# Patient Record
Sex: Female | Born: 1965 | Race: Black or African American | Hispanic: No | Smoking: Former smoker
Health system: Southern US, Community
[De-identification: ages and names within clinical notes are randomized; demographics above are authoritative.]

## PROBLEM LIST (undated history)

## (undated) DIAGNOSIS — F419 Anxiety disorder, unspecified: Secondary | ICD-10-CM

## (undated) DIAGNOSIS — F1990 Other psychoactive substance use, unspecified, uncomplicated: Secondary | ICD-10-CM

## (undated) DIAGNOSIS — G4730 Sleep apnea, unspecified: Secondary | ICD-10-CM

## (undated) DIAGNOSIS — E559 Vitamin D deficiency, unspecified: Secondary | ICD-10-CM

## (undated) DIAGNOSIS — M170 Bilateral primary osteoarthritis of knee: Secondary | ICD-10-CM

## (undated) DIAGNOSIS — R12 Heartburn: Secondary | ICD-10-CM

## (undated) DIAGNOSIS — M25475 Effusion, left foot: Secondary | ICD-10-CM

## (undated) DIAGNOSIS — M7070 Other bursitis of hip, unspecified hip: Secondary | ICD-10-CM

## (undated) DIAGNOSIS — M1990 Unspecified osteoarthritis, unspecified site: Secondary | ICD-10-CM

## (undated) DIAGNOSIS — F32A Depression, unspecified: Secondary | ICD-10-CM

## (undated) DIAGNOSIS — M25472 Effusion, left ankle: Secondary | ICD-10-CM

## (undated) DIAGNOSIS — M719 Bursopathy, unspecified: Secondary | ICD-10-CM

## (undated) DIAGNOSIS — K5900 Constipation, unspecified: Secondary | ICD-10-CM

## (undated) DIAGNOSIS — R0602 Shortness of breath: Secondary | ICD-10-CM

## (undated) DIAGNOSIS — M25474 Effusion, right foot: Secondary | ICD-10-CM

## (undated) DIAGNOSIS — E669 Obesity, unspecified: Secondary | ICD-10-CM

## (undated) DIAGNOSIS — E739 Lactose intolerance, unspecified: Secondary | ICD-10-CM

## (undated) DIAGNOSIS — M25471 Effusion, right ankle: Secondary | ICD-10-CM

## (undated) DIAGNOSIS — M2550 Pain in unspecified joint: Secondary | ICD-10-CM

## (undated) DIAGNOSIS — I509 Heart failure, unspecified: Secondary | ICD-10-CM

## (undated) DIAGNOSIS — I10 Essential (primary) hypertension: Secondary | ICD-10-CM

## (undated) DIAGNOSIS — F329 Major depressive disorder, single episode, unspecified: Secondary | ICD-10-CM

## (undated) DIAGNOSIS — C801 Malignant (primary) neoplasm, unspecified: Secondary | ICD-10-CM

## (undated) HISTORY — DX: Other psychoactive substance use, unspecified, uncomplicated: F19.90

## (undated) HISTORY — DX: Constipation, unspecified: K59.00

## (undated) HISTORY — DX: Bilateral primary osteoarthritis of knee: M17.0

## (undated) HISTORY — DX: Depression, unspecified: F32.A

## (undated) HISTORY — DX: Effusion, left foot: M25.475

## (undated) HISTORY — DX: Sleep apnea, unspecified: G47.30

## (undated) HISTORY — DX: Unspecified osteoarthritis, unspecified site: M19.90

## (undated) HISTORY — DX: Other bursitis of hip, unspecified hip: M70.70

## (undated) HISTORY — DX: Bursopathy, unspecified: M71.9

## (undated) HISTORY — DX: Pain in unspecified joint: M25.50

## (undated) HISTORY — DX: Lactose intolerance, unspecified: E73.9

## (undated) HISTORY — DX: Anxiety disorder, unspecified: F41.9

## (undated) HISTORY — DX: Vitamin D deficiency, unspecified: E55.9

## (undated) HISTORY — DX: Heart failure, unspecified: I50.9

## (undated) HISTORY — DX: Heartburn: R12

## (undated) HISTORY — DX: Effusion, right ankle: M25.471

## (undated) HISTORY — DX: Effusion, left ankle: M25.472

## (undated) HISTORY — DX: Effusion, right foot: M25.474

## (undated) HISTORY — DX: Shortness of breath: R06.02

---
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
## 1997-10-05 ENCOUNTER — Ambulatory Visit (HOSPITAL_COMMUNITY): Admission: RE | Admit: 1997-10-05 | Discharge: 1997-10-05 | Payer: Self-pay | Admitting: Podiatry

## 2002-10-20 ENCOUNTER — Ambulatory Visit (HOSPITAL_BASED_OUTPATIENT_CLINIC_OR_DEPARTMENT_OTHER): Admission: RE | Admit: 2002-10-20 | Discharge: 2002-10-20 | Payer: Self-pay | Admitting: Internal Medicine

## 2003-08-21 ENCOUNTER — Ambulatory Visit (HOSPITAL_COMMUNITY): Admission: RE | Admit: 2003-08-21 | Discharge: 2003-08-21 | Payer: Self-pay | Admitting: *Deleted

## 2003-08-21 ENCOUNTER — Encounter (INDEPENDENT_AMBULATORY_CARE_PROVIDER_SITE_OTHER): Payer: Self-pay | Admitting: Specialist

## 2006-10-15 ENCOUNTER — Encounter: Admission: RE | Admit: 2006-10-15 | Discharge: 2006-10-15 | Payer: Self-pay | Admitting: Internal Medicine

## 2007-10-28 ENCOUNTER — Encounter: Admission: RE | Admit: 2007-10-28 | Discharge: 2007-10-28 | Payer: Self-pay | Admitting: Internal Medicine

## 2007-12-17 ENCOUNTER — Encounter: Admission: RE | Admit: 2007-12-17 | Discharge: 2007-12-17 | Payer: Self-pay | Admitting: Internal Medicine

## 2008-05-15 ENCOUNTER — Ambulatory Visit (HOSPITAL_COMMUNITY): Admission: RE | Admit: 2008-05-15 | Discharge: 2008-05-15 | Payer: Self-pay | Admitting: Obstetrics

## 2009-12-04 ENCOUNTER — Inpatient Hospital Stay (HOSPITAL_COMMUNITY): Admission: EM | Admit: 2009-12-04 | Discharge: 2009-12-07 | Payer: Self-pay | Admitting: Emergency Medicine

## 2009-12-05 ENCOUNTER — Encounter (INDEPENDENT_AMBULATORY_CARE_PROVIDER_SITE_OTHER): Payer: Self-pay | Admitting: Internal Medicine

## 2010-01-09 HISTORY — PX: KNEE SURGERY: SHX244

## 2010-03-22 LAB — LIPID PANEL
LDL Cholesterol: 108 mg/dL — ABNORMAL HIGH (ref 0–99)
Total CHOL/HDL Ratio: 3.8 RATIO
Triglycerides: 72 mg/dL (ref <150)
VLDL: 14 mg/dL (ref 0–40)

## 2010-03-22 LAB — CK TOTAL AND CKMB (NOT AT ARMC)
CK, MB: 3.8 ng/mL (ref 0.3–4.0)
CK, MB: 4.5 ng/mL — ABNORMAL HIGH (ref 0.3–4.0)
Relative Index: 1.7 (ref 0.0–2.5)
Relative Index: 1.9 (ref 0.0–2.5)
Relative Index: 2.1 (ref 0.0–2.5)
Total CK: 202 U/L — ABNORMAL HIGH (ref 7–177)
Total CK: 215 U/L — ABNORMAL HIGH (ref 7–177)

## 2010-03-22 LAB — CBC
HCT: 37.5 % (ref 36.0–46.0)
HCT: 40 % (ref 36.0–46.0)
Hemoglobin: 12.8 g/dL (ref 12.0–15.0)
Hemoglobin: 12.9 g/dL (ref 12.0–15.0)
MCH: 31.5 pg (ref 26.0–34.0)
MCHC: 32.7 g/dL (ref 30.0–36.0)
MCHC: 34.4 g/dL (ref 30.0–36.0)
MCV: 92.8 fL (ref 78.0–100.0)
MCV: 93.2 fL (ref 78.0–100.0)
Platelets: 256 10*3/uL (ref 150–400)
RDW: 15 % (ref 11.5–15.5)
RDW: 15.2 % (ref 11.5–15.5)
WBC: 9.4 10*3/uL (ref 4.0–10.5)

## 2010-03-22 LAB — POCT CARDIAC MARKERS
CKMB, poc: 2.3 ng/mL (ref 1.0–8.0)
Myoglobin, poc: 69.2 ng/mL (ref 12–200)

## 2010-03-22 LAB — BASIC METABOLIC PANEL
BUN: 10 mg/dL (ref 6–23)
CO2: 26 mEq/L (ref 19–32)
CO2: 28 mEq/L (ref 19–32)
CO2: 29 mEq/L (ref 19–32)
Chloride: 101 mEq/L (ref 96–112)
Chloride: 102 mEq/L (ref 96–112)
Chloride: 105 mEq/L (ref 96–112)
Creatinine, Ser: 0.95 mg/dL (ref 0.4–1.2)
Creatinine, Ser: 1.13 mg/dL (ref 0.4–1.2)
GFR calc Af Amer: >60 mL/min (ref >60)
GFR calc non Af Amer: 55 mL/min — ABNORMAL LOW (ref >60)
Glucose, Bld: 101 mg/dL — ABNORMAL HIGH (ref 70–99)
Potassium: 3.9 mEq/L (ref 3.5–5.1)
Potassium: 4.2 mEq/L (ref 3.5–5.1)
Sodium: 140 mEq/L (ref 135–145)

## 2010-03-22 LAB — URINALYSIS, ROUTINE W REFLEX MICROSCOPIC
Bilirubin Urine: NEGATIVE
Ketones, ur: NEGATIVE mg/dL
Nitrite: NEGATIVE
Urobilinogen, UA: 0.2 mg/dL (ref 0.0–1.0)

## 2010-03-22 LAB — MAGNESIUM: Magnesium: 2 mg/dL (ref 1.5–2.5)

## 2010-03-22 LAB — MRSA PCR SCREENING: MRSA by PCR: NEGATIVE

## 2010-03-22 LAB — RAPID URINE DRUG SCREEN, HOSP PERFORMED: Tetrahydrocannabinol: POSITIVE — AB

## 2010-03-22 LAB — HEMOGLOBIN A1C: Mean Plasma Glucose: 111 mg/dL (ref <117)

## 2010-04-19 LAB — CBC
MCV: 95.3 fL (ref 78.0–100.0)
RBC: 4.1 MIL/uL (ref 3.87–5.11)
WBC: 9.2 10*3/uL (ref 4.0–10.5)

## 2010-05-27 NOTE — Op Note (Signed)
NAMECHIQUITTA, MATTY                           ACCOUNT NO.:  [id number]   MEDICAL RECORD NO.:  [id number]                   PATIENT TYPE:  AMB   LOCATION:  SDC                                  FACILITY:  [facility]   PHYSICIAN:  Narka B. Earlene Plater, M.D.               DATE OF BIRTH:  Feb 25, 1965   DATE OF PROCEDURE:  08/21/2003  DATE OF DISCHARGE:                                 OPERATIVE REPORT   PREOPERATIVE DIAGNOSES:  Nine week missed abortion.   POSTOPERATIVE DIAGNOSES:  Nine week missed abortion.   PROCEDURE:  Suction curettage.   ANESTHESIA:  MAC and 20 mL 2% lidocaine paracervical block.   SPECIMENS:  Products of conception.   COMPLICATIONS:  None.   INDICATIONS FOR PROCEDURE:  Patient with an unintended pregnancy with unsure  last menstrual period. Ultrasound in the office showed a nine week missed  AB, presents for suction curettage.   DESCRIPTION OF PROCEDURE:  The patient was taken to the operating room and  MAC anesthesia obtained. She was placed in the [place] stirrups and then  prepped and draped in a standard fashion.  The bladder was emptied with a  red rubber catheter.   Speculum inserted, paracervical block placed. Cervix dilated to a #21  without difficulty. The #7 curved suction cannula inserted. With suction on  products return.  Several passes were required to clear the uterus.  The  endometrium was then curetted and a gritty texture noted throughout. The  suction was passed once again and no additional tissue returned. The single  tooth was removed, cervix was hemostatic.   The patient tolerated the procedure well and there were no complications.  She was taken to the recovery room awake, alert in stable condition.                                               Gerri Spore B. Earlene Plater, M.D.    WBD/MEDQ  D:  08/21/2003  T:  08/22/2003  Job:  [id number]

## 2011-04-30 ENCOUNTER — Encounter (HOSPITAL_COMMUNITY): Payer: Self-pay | Admitting: Emergency Medicine

## 2011-04-30 ENCOUNTER — Emergency Department (HOSPITAL_COMMUNITY): Payer: 59

## 2011-04-30 ENCOUNTER — Emergency Department (HOSPITAL_COMMUNITY)
Admission: EM | Admit: 2011-04-30 | Discharge: 2011-04-30 | Disposition: A | Discharge status: Home / Self Care | Payer: 59 | Attending: Emergency Medicine | Admitting: Emergency Medicine

## 2011-04-30 DIAGNOSIS — I10 Essential (primary) hypertension: Secondary | ICD-10-CM | POA: Insufficient documentation

## 2011-04-30 DIAGNOSIS — R0602 Shortness of breath: Secondary | ICD-10-CM | POA: Insufficient documentation

## 2011-04-30 DIAGNOSIS — I509 Heart failure, unspecified: Secondary | ICD-10-CM | POA: Insufficient documentation

## 2011-04-30 HISTORY — DX: Obesity, unspecified: E66.9

## 2011-04-30 HISTORY — DX: Essential (primary) hypertension: I10

## 2011-04-30 HISTORY — DX: Malignant (primary) neoplasm, unspecified: C80.1

## 2011-04-30 LAB — BASIC METABOLIC PANEL
CO2: 23 mEq/L (ref 19–32)
Calcium: 8.9 mg/dL (ref 8.4–10.5)
Chloride: 104 mEq/L (ref 96–112)
Creatinine, Ser: 1.13 mg/dL — ABNORMAL HIGH (ref 0.50–1.10)
Glucose, Bld: 97 mg/dL (ref 70–99)

## 2011-04-30 LAB — CBC
HCT: 36.8 % (ref 36.0–46.0)
Hemoglobin: 12.4 g/dL (ref 12.0–15.0)
MCV: 93.2 fL (ref 78.0–100.0)
RBC: 3.95 MIL/uL (ref 3.87–5.11)
WBC: 4.9 10*3/uL (ref 4.0–10.5)

## 2011-04-30 LAB — DIFFERENTIAL
Eosinophils Relative: 2 % (ref 0–5)
Lymphocytes Relative: 34 % (ref 12–46)
Lymphs Abs: 1.7 10*3/uL (ref 0.7–4.0)
Monocytes Absolute: 0.6 10*3/uL (ref 0.1–1.0)
Monocytes Relative: 12 % (ref 3–12)

## 2011-04-30 MED ORDER — FUROSEMIDE 40 MG PO TABS: 40.0000 mg | ORAL_TABLET | Freq: Every day | ORAL | Status: DC

## 2011-04-30 MED ORDER — FUROSEMIDE 10 MG/ML IJ SOLN
40.0000 mg | Freq: Once | INTRAMUSCULAR | Status: AC
  Administered 2011-04-30: 40 mg via INTRAVENOUS
  Filled 2011-04-30: qty 4

## 2011-04-30 NOTE — Discharge Instructions (Signed)
Your chest x-ray, and blood test show mild heart failure.  Your oxygen saturation is normal.  Use Lasix to remove fluid from your lungs.  Followup with your cardiologist as scheduled.  Return for worse or uncontrolled symptoms

## 2011-04-30 NOTE — ED Notes (Signed)
PT. REPORTS PROGRESSING SOB WITH PRODUCTIVE COUGH FOR SEVERAL DAYS WITH BILATERAL LOWER LEGS SWELLING / EDEMA. STATES HISTORY OF CHF.

## 2011-04-30 NOTE — ED Provider Notes (Signed)
History     CSN: [id number]  Arrival date & time 04/30/11  7829   First MD Initiated Contact with Patient 04/30/11 0301      Chief Complaint  Patient presents with  . Shortness of Breath    (Consider location/radiation/quality/duration/timing/severity/associated sxs/prior treatment) Patient is a 46 y.o. female presenting with shortness of breath. The history is provided by the patient.  Shortness of Breath  Associated symptoms include chest pain, cough and shortness of breath. Pertinent negatives include no fever.   the patient is a 46 year old morbidly obese female, with a history of congestive heart failure, who presents to emergency department complaining of aggressive cough with yellow sputum, and shortness of breath, with past 2 weeks.  She states that she ran out of her Lasix.  She denies nausea, vomiting, fevers, chills.  She states that her legs are swollen.  2.  She does not have orthopnea.  She is able to sleep on her side at nighttime.  She does have nocturia, however.  She has been having intermittent chest pain, as well, but it only last for a few seconds at a time.  Currently, she has no chest  Past Medical History  Diagnosis Date  . Obesity   . Cancer   . Hypertension     Past Surgical History  Procedure Date  . Cesarean section     History reviewed. No pertinent family history.  History  Substance Use Topics  . Smoking status: Never Smoker   . Smokeless tobacco: Not on file  . Alcohol Use: Yes    OB History    Grav Para Term Preterm Abortions TAB SAB Ect Mult Living                  Review of Systems  Constitutional: Negative for fever and chills.  Respiratory: Positive for cough and shortness of breath. Negative for chest tightness.   Cardiovascular: Positive for chest pain.       Chest pain is intermittent, and only a second  Gastrointestinal: Negative for nausea and vomiting.  Neurological: Negative for headaches.  Psychiatric/Behavioral:  Negative for confusion.  All other systems reviewed and are negative.    Allergies  Review of patient's allergies indicates no known allergies.  Home Medications   Current Outpatient Rx  Name Route Sig Dispense Refill  . VITAMIN D-3 PO Oral Take 1 capsule by mouth daily.    Marland Kitchen HYDRALAZINE HCL 50 MG PO TABS Oral Take 50 mg by mouth 3 (three) times daily.    Marland Kitchen LABETALOL HCL 200 MG PO TABS Oral Take 600 mg by mouth 2 (two) times daily.    Marland Kitchen POTASSIUM CHLORIDE CRYS ER 20 MEQ PO TBCR Oral Take 20 mEq by mouth daily.    Marland Kitchen SIMVASTATIN 10 MG PO TABS Oral Take 10 mg by mouth daily.      BP 171/114  Pulse 88  Temp 98.8 F (37.1 C)  Resp 16  SpO2 95%  LMP 04/29/2011  Physical Exam  Vitals reviewed. Constitutional: She is oriented to person, place, and time. No distress.       Morbidly obese, speaking in full sentences without  HENT:  Head: Normocephalic and atraumatic.  Eyes: Conjunctivae are normal.  Neck: Normal range of motion. Neck supple.  Cardiovascular: Normal rate.   No murmur heard. Pulmonary/Chest: Effort normal. No respiratory distress. She has no wheezes. She has no rales.  Abdominal: Soft. She exhibits no distension.  Musculoskeletal: Normal range of motion. She exhibits no  edema and no tenderness.  Neurological: She is alert and oriented to person, place, and time.  Skin: Skin is warm and dry.  Psychiatric: She has a normal mood and affect. Thought content normal.    ED Course  Procedures (including critical care time) 46 year old, female, who is morbidly obese and has history of congestive heart failure, presents with progressive cough, and shortness of breath.  For 2 weeks since she ran out of Lasix.  She appears to be in no distress.  Her lungs are clear.  However, given her symptoms and the fact she has not been on Lasix.  We will perform a chest x-ray, and laboratory testing, to see if she is in heart failure.   Labs Reviewed  CBC  DIFFERENTIAL  BASIC  METABOLIC PANEL  PRO B NATRIURETIC PEPTIDE   No results found.   ED ECG REPORT     ECG ECG.  Time 0 258 Normal sinus rhythm with rate 85 Normal No conduction abnormalities Left atrial enlargement, and nonspecific T-wave changes Impression normal sinus rhythm with left atrial enlargement, and nonspecific T-wave changes       4:15 AM Just started urinating after lasix.   I explained cxr and lab findings and plan to release her.  She is very happy that she does not require admission. Sat on ra is 98% with unlabored resp.   MDM  Cough Very mild chf. No resp distress or hypoxia.        Cheri Guppy, MD 04/30/11 [phone]

## 2011-04-30 NOTE — ED Notes (Signed)
Pt reports having symptoms of CHF for a week.  States that she has had edema in bilateral legs, wheezing, coughing up yellow sputum and a 'full stomach'.  Pt reports intermittent CP.  Denies N/V.  Skin warm, dry and intact.  Neuro intact.  Pt is supposed to take 20mg  lasix daily but hasn't taken it since December.  Has an appointment with cardiologist on Thursday.pt speaking in complete sentences, able to lay down flat.

## 2011-04-30 NOTE — ED Notes (Signed)
Rx x 1, pt voiced understanding to f/u with cardiologist and take rx as prescribed.

## 2011-08-02 ENCOUNTER — Other Ambulatory Visit: Payer: Self-pay | Admitting: Internal Medicine

## 2011-08-02 DIAGNOSIS — Z1231 Encounter for screening mammogram for malignant neoplasm of breast: Secondary | ICD-10-CM

## 2012-01-31 ENCOUNTER — Encounter (HOSPITAL_BASED_OUTPATIENT_CLINIC_OR_DEPARTMENT_OTHER): Payer: 59 | Attending: General Surgery

## 2012-01-31 DIAGNOSIS — I872 Venous insufficiency (chronic) (peripheral): Secondary | ICD-10-CM | POA: Insufficient documentation

## 2012-01-31 DIAGNOSIS — L97809 Non-pressure chronic ulcer of other part of unspecified lower leg with unspecified severity: Secondary | ICD-10-CM | POA: Insufficient documentation

## 2012-01-31 DIAGNOSIS — I10 Essential (primary) hypertension: Secondary | ICD-10-CM | POA: Insufficient documentation

## 2012-02-01 NOTE — Progress Notes (Signed)
[facility]  NAME:  Erin Barrett, Erin Barrett               ACCOUNT NO.:  [id number]  MEDICAL RECORD NO.:  [id number]      DATE OF BIRTH:  18-Jul-1965  PHYSICIAN:  Ardath Sax, M.D.           VISIT DATE:                                  OFFICE VISIT   She is a morbidly obese 47 year old lady who suffered some trauma to her lateral left leg and has a nonhealing ulcer for a couple of months.  She has venous hypertension and chronic venous ulcer that really has caused a trauma to develop in this longstanding ulcer.  It is about 2 cm in diameter.  I did clean it up with a curette and got some of the devitalized tissue out and we are going to treat this with silver alginate and then perhaps later we can use an Apligraf when it is cleaned up.  She is as I said a very obese.  She weighs 380 pounds.  She is active as a Dietitian.  She does take some medicine for mild hypertension.  She takes labetalol and she also takes Lasix.  So, we will see her in next week.  We will treat her with silver alginate.  I will say her diagnosis, 1. Traumatic ulcer of lateral left leg complicated by venous     hypertension. 2. Morbid obesity. 3. Hypertension.     Ardath Sax, M.D.     PP/MEDQ  D:  01/31/2012  T:  02/01/2012  Job:  [id number]

## 2012-02-14 ENCOUNTER — Encounter (HOSPITAL_BASED_OUTPATIENT_CLINIC_OR_DEPARTMENT_OTHER): Payer: 59 | Attending: General Surgery

## 2012-02-14 DIAGNOSIS — L97909 Non-pressure chronic ulcer of unspecified part of unspecified lower leg with unspecified severity: Secondary | ICD-10-CM | POA: Insufficient documentation

## 2012-03-13 ENCOUNTER — Encounter (HOSPITAL_BASED_OUTPATIENT_CLINIC_OR_DEPARTMENT_OTHER): Payer: 59 | Attending: General Surgery

## 2012-03-13 DIAGNOSIS — L97809 Non-pressure chronic ulcer of other part of unspecified lower leg with unspecified severity: Secondary | ICD-10-CM | POA: Insufficient documentation

## 2012-03-13 DIAGNOSIS — I87309 Chronic venous hypertension (idiopathic) without complications of unspecified lower extremity: Secondary | ICD-10-CM | POA: Insufficient documentation

## 2012-04-10 ENCOUNTER — Encounter (HOSPITAL_BASED_OUTPATIENT_CLINIC_OR_DEPARTMENT_OTHER): Payer: 59 | Attending: General Surgery

## 2012-04-10 DIAGNOSIS — L97909 Non-pressure chronic ulcer of unspecified part of unspecified lower leg with unspecified severity: Secondary | ICD-10-CM | POA: Insufficient documentation

## 2012-04-10 DIAGNOSIS — I87319 Chronic venous hypertension (idiopathic) with ulcer of unspecified lower extremity: Secondary | ICD-10-CM | POA: Insufficient documentation

## 2013-04-17 ENCOUNTER — Encounter: Payer: Self-pay | Admitting: Interventional Cardiology

## 2013-04-17 DIAGNOSIS — C801 Malignant (primary) neoplasm, unspecified: Secondary | ICD-10-CM | POA: Insufficient documentation

## 2013-04-17 DIAGNOSIS — I10 Essential (primary) hypertension: Secondary | ICD-10-CM | POA: Insufficient documentation

## 2013-05-05 ENCOUNTER — Ambulatory Visit (INDEPENDENT_AMBULATORY_CARE_PROVIDER_SITE_OTHER): Payer: 59 | Admitting: Interventional Cardiology

## 2013-05-05 ENCOUNTER — Encounter: Payer: Self-pay | Admitting: Interventional Cardiology

## 2013-05-05 VITALS — BP 154/109 | HR 79 | Ht 69.0 in | Wt 385.4 lb

## 2013-05-05 DIAGNOSIS — R0609 Other forms of dyspnea: Secondary | ICD-10-CM

## 2013-05-05 DIAGNOSIS — I5032 Chronic diastolic (congestive) heart failure: Secondary | ICD-10-CM

## 2013-05-05 DIAGNOSIS — R0600 Dyspnea, unspecified: Secondary | ICD-10-CM | POA: Insufficient documentation

## 2013-05-05 DIAGNOSIS — R0989 Other specified symptoms and signs involving the circulatory and respiratory systems: Secondary | ICD-10-CM

## 2013-05-05 DIAGNOSIS — I10 Essential (primary) hypertension: Secondary | ICD-10-CM | POA: Insufficient documentation

## 2013-05-05 LAB — BASIC METABOLIC PANEL
BUN: 13 mg/dL (ref 6–23)
CALCIUM: 9.1 mg/dL (ref 8.4–10.5)
CHLORIDE: 103 meq/L (ref 96–112)
CO2: 26 mEq/L (ref 19–32)
CREATININE: 1 mg/dL (ref 0.4–1.2)
GFR: 75.63 mL/min (ref >60.00)
Glucose, Bld: 92 mg/dL (ref 70–99)
Potassium: 3.6 mEq/L (ref 3.5–5.1)
Sodium: 138 mEq/L (ref 135–145)

## 2013-05-05 LAB — BRAIN NATRIURETIC PEPTIDE: Pro B Natriuretic peptide (BNP): 48 pg/mL (ref 0.0–100.0)

## 2013-05-05 MED ORDER — POTASSIUM CHLORIDE CRYS ER 20 MEQ PO TBCR: 20.0000 meq | EXTENDED_RELEASE_TABLET | Freq: Every day | ORAL | Status: DC

## 2013-05-05 MED ORDER — FUROSEMIDE 80 MG PO TABS: 80.0000 mg | ORAL_TABLET | Freq: Every day | ORAL | Status: DC

## 2013-05-05 NOTE — Progress Notes (Signed)
Patient ID: Erin Barrett, female   DOB: Jun 01, 1965, 48 y.o.   MRN: [id number]   Date: 05/05/2013 ID: Erin Barrett, DOB 1965-11-02, MRN [id number] PCP: [name], MD  Reason: Dyspnea  ASSESSMENT;  1. Dyspnea 2. Chronic diastolic heart failure, rule out decompensation 3. Hypertension, poorly controlled 4. Morbid obesity 5. Obstructive sleep apnea  PLAN:  1. increase furosemide 80 mg daily 2. Potassium 20 mEq per day 3. BNP 4. 2-D Doppler echocardiogram 5. Weight loss. We discussed bariatric surgery. Her insurance, he has denied this treatment   SUBJECTIVE: Erin Barrett is a 48 y.o. female who is referred by Dr. Baird Cancer for evaluation of dyspnea. She has a history of heart failure (diastolic). She has low back discomfort which is a relatively new problem. This is caused a decrease in her typical physical activity and also a 30-40 pound weight gain over the last 6-8 months. She denies orthopnea. She sleeps with CPAP mass. She has not noted lower extremity swelling. She denies chest pain. She does have a history of coronary artery disease in her family (father). She does not smoke cigarettes. She denies syncope or prolonged palpitations. She admits to medication compliance for the most part although occasionally she has slept off of her medications and had decompensation. She denies any recent medication noncompliance.   No Known Allergies  Current Outpatient Prescriptions on File Prior to Visit  Medication Sig Dispense Refill  . Cholecalciferol (VITAMIN D-3 PO) Take 1 capsule by mouth daily.      . furosemide (LASIX) 40 MG tablet Take 1 tablet (40 mg total) by mouth daily.  90 tablet  3  . hydrALAZINE (APRESOLINE) 50 MG tablet Take 50 mg by mouth 3 (three) times daily.      Marland Kitchen labetalol (NORMODYNE) 200 MG tablet Take 600 mg by mouth 2 (two) times daily.      . simvastatin (ZOCOR) 10 MG tablet Take 10 mg by mouth daily.       No current facility-administered medications on file  prior to visit.    Past Medical History  Diagnosis Date  . Obesity   . Cancer   . Hypertension     Past Surgical History  Procedure Laterality Date  . Cesarean section      History   Social History  . Marital Status: Legally Separated    Spouse Name: N/A    Number of Children: N/A  . Years of Education: N/A   Occupational History  . Not on file.   Social History Main Topics  . Smoking status: Never Smoker   . Smokeless tobacco: Not on file  . Alcohol Use: Yes  . Drug Use: No  . Sexual Activity: Not on file   Other Topics Concern  . Not on file   Social History Narrative  . No narrative on file    Family History  Problem Relation Age of Onset  . CVA Mother   . Heart disease Father     ROS: Headache, neurological complaints, claudication, fever, chills, weight loss, and skin rash appear. Other systems negative for complaints.  OBJECTIVE: BP 154/109  Pulse 79  Ht 5\' 9"  (1.753 m)  Wt 385 lb 6.4 oz (174.816 kg)  BMI 56.89 kg/m2,  General: No acute distress, morbidly obese HEENT: normal no jaundice or pallor Neck: JVD difficult to evaluate but apparently flat. Carotids absent Chest: Clear Cardiac: Murmur: Absent. Gallop: S4. Rhythm: Stable. Other: Normal Abdomen: Bruit: Absent but difficult to evaluate due to obesity. Pulsation: Absent again  difficult to evaluate.  Extremities: Edema: Trace bilateral. Pulses: 2+ Neuro: Normal Psych: Normal  ECG: Normal sinus rhythm nonspecific T wave flattening

## 2013-05-05 NOTE — Patient Instructions (Addendum)
Your physician has recommended you make the following change in your medication:  1) INCREASE Lasix to 80mg  daily 2) START Potassium 42meq daily  Lab Today: [place], [place] physician has requested that you have an echocardiogram. Echocardiography is a painless test that uses sound waves to create images of your heart. It provides your doctor with information about the size and shape of your heart and how well your heart's chambers and valves are working. This procedure takes approximately one hour. There are no restrictions for this procedure.   You have a follow up appointment scheduled on 06/16/13 @10 :15am

## 2013-05-07 ENCOUNTER — Ambulatory Visit (HOSPITAL_COMMUNITY): Payer: 59 | Attending: Cardiovascular Disease | Admitting: Cardiology

## 2013-05-07 DIAGNOSIS — I5032 Chronic diastolic (congestive) heart failure: Secondary | ICD-10-CM | POA: Insufficient documentation

## 2013-05-07 DIAGNOSIS — R0989 Other specified symptoms and signs involving the circulatory and respiratory systems: Secondary | ICD-10-CM | POA: Insufficient documentation

## 2013-05-07 DIAGNOSIS — R0609 Other forms of dyspnea: Secondary | ICD-10-CM | POA: Insufficient documentation

## 2013-05-07 DIAGNOSIS — R0600 Dyspnea, unspecified: Secondary | ICD-10-CM

## 2013-05-07 NOTE — Progress Notes (Signed)
Echo performed. 

## 2013-05-08 ENCOUNTER — Telehealth: Payer: Self-pay

## 2013-05-08 ENCOUNTER — Encounter: Payer: Self-pay | Admitting: Interventional Cardiology

## 2013-05-08 DIAGNOSIS — I5032 Chronic diastolic (congestive) heart failure: Secondary | ICD-10-CM

## 2013-05-08 MED ORDER — FUROSEMIDE 40 MG PO TABS: 40.0000 mg | ORAL_TABLET | Freq: Every day | ORAL | Status: DC

## 2013-05-08 NOTE — Telephone Encounter (Signed)
Message copied by Lamar Laundry on Thu May 08, 2013  9:07 AM ------      Message from: Daneen Schick      Created: Mon May 05, 2013  7:59 PM       Let her know labs look okay and her dyspnea probably related to deconditioning and recent weight increase. Go back to usual lasix dose instead of higher dose recommended at OV ------

## 2013-05-08 NOTE — Telephone Encounter (Signed)
called to give pt lab results and Dr.Smith instructions.lmom for pt to call back

## 2013-05-08 NOTE — Telephone Encounter (Signed)
pt called back.pt aware of lab results and Dr.Smith instructions.Let her know labs look okay and her dyspnea probably related to deconditioning and recent weight increase. Go back to usual lasix dose instead of higher dose recommended at OV.pt verbalized understanding.

## 2013-05-08 NOTE — Telephone Encounter (Signed)
Message copied by Lamar Laundry on Thu May 08, 2013 10:10 AM ------      Message from: Daneen Schick      Created: Mon May 05, 2013  7:59 PM       Let her know labs look okay and her dyspnea probably related to deconditioning and recent weight increase. Go back to usual lasix dose instead of higher dose recommended at OV ------

## 2013-05-12 ENCOUNTER — Telehealth: Payer: Self-pay

## 2013-05-12 NOTE — Telephone Encounter (Signed)
Message copied by Lamar Laundry on Mon May 12, 2013 10:09 AM ------      Message from: Daneen Schick      Created: Wed May 07, 2013  5:44 PM       Thick heart muscle with increased pressure. Increase the Labetalol to 300 mg BID and have f/u in 1 month about dyspnea and bp. ------

## 2013-05-12 NOTE — Telephone Encounter (Signed)
2nd attempt lmom for pt to callback 

## 2013-05-12 NOTE — Telephone Encounter (Signed)
Message copied by Lamar Laundry on Mon May 12, 2013 11:01 AM ------      Message from: Daneen Schick      Created: Wed May 07, 2013  5:44 PM       Thick heart muscle with increased pressure. Increase the Labetalol to 300 mg BID and have f/u in 1 month about dyspnea and bp. ------

## 2013-05-12 NOTE — Telephone Encounter (Signed)
pt returned call.pt aware of echo results.Thick heart muscle with increased pressure. Increase the Labetalol to 300 mg BID and have f/u in 1 month about dyspnea and bp.pt sts that she thinks that the dosage of labetolol we have listed is incorrect.pt sts she thinks she is taking 600mg  bid pt unsure of the mg tablet she has .pt adv to confirm her dosage when she gets home by looking at her bottle and call back, so update dosage can be given to Dr.Smith.pt agreeable with plan and verbalized understanding.

## 2013-05-13 NOTE — Telephone Encounter (Signed)
Will forward to Dr Tamala Julian and nurse as instructed

## 2013-05-13 NOTE — Telephone Encounter (Signed)
New Prob   Wanted to notify Dr. Tamala Julian she is currently taking Labetalol 200 mg 3 times daily.

## 2013-05-15 NOTE — Telephone Encounter (Signed)
Increase to 400 mg BID

## 2013-05-16 MED ORDER — LABETALOL HCL 200 MG PO TABS: 400.0000 mg | ORAL_TABLET | Freq: Two times a day (BID) | ORAL | Status: DC

## 2013-05-16 NOTE — Telephone Encounter (Signed)
pt given Dr.Smith instructions. increase labetalol to 400mg  bid. pt verbalized understanding.

## 2013-06-16 ENCOUNTER — Ambulatory Visit: Payer: 59 | Admitting: Interventional Cardiology

## 2013-07-01 ENCOUNTER — Ambulatory Visit: Payer: 59 | Admitting: Interventional Cardiology

## 2013-07-22 ENCOUNTER — Ambulatory Visit: Payer: 59 | Admitting: Interventional Cardiology

## 2014-05-21 ENCOUNTER — Other Ambulatory Visit: Payer: Self-pay | Admitting: Interventional Cardiology

## 2014-07-17 ENCOUNTER — Other Ambulatory Visit: Payer: Self-pay | Admitting: Interventional Cardiology

## 2014-08-15 ENCOUNTER — Other Ambulatory Visit: Payer: Self-pay | Admitting: Interventional Cardiology

## 2014-11-11 ENCOUNTER — Other Ambulatory Visit: Payer: Self-pay | Admitting: Obstetrics

## 2014-11-11 DIAGNOSIS — R928 Other abnormal and inconclusive findings on diagnostic imaging of breast: Secondary | ICD-10-CM

## 2014-11-17 ENCOUNTER — Other Ambulatory Visit: Payer: Self-pay

## 2014-11-19 ENCOUNTER — Ambulatory Visit
Admission: RE | Admit: 2014-11-19 | Discharge: 2014-11-19 | Disposition: A | Discharge status: Home / Self Care | Payer: 59 | Source: Ambulatory Visit | Attending: Obstetrics | Admitting: Obstetrics

## 2014-11-19 DIAGNOSIS — R928 Other abnormal and inconclusive findings on diagnostic imaging of breast: Secondary | ICD-10-CM

## 2015-02-01 ENCOUNTER — Other Ambulatory Visit: Payer: Self-pay | Admitting: *Deleted

## 2015-02-01 ENCOUNTER — Other Ambulatory Visit: Payer: Self-pay | Admitting: Interventional Cardiology

## 2015-02-01 MED ORDER — POTASSIUM CHLORIDE CRYS ER 20 MEQ PO TBCR: 20.0000 meq | EXTENDED_RELEASE_TABLET | Freq: Every day | ORAL | Status: DC

## 2015-02-01 NOTE — Telephone Encounter (Signed)
Ok to refill #30 R-0.  It she is not going to f/u with cardiology yearly, her refills rqst should go to her pcp.

## 2015-02-27 ENCOUNTER — Other Ambulatory Visit: Payer: Self-pay | Admitting: Interventional Cardiology

## 2015-03-02 ENCOUNTER — Other Ambulatory Visit: Payer: Self-pay | Admitting: *Deleted

## 2015-03-16 ENCOUNTER — Other Ambulatory Visit: Payer: Self-pay | Admitting: Interventional Cardiology

## 2015-03-31 ENCOUNTER — Ambulatory Visit: Payer: 59 | Admitting: Podiatry

## 2015-05-30 ENCOUNTER — Other Ambulatory Visit: Payer: Self-pay | Admitting: Interventional Cardiology

## 2015-11-11 ENCOUNTER — Other Ambulatory Visit: Payer: Self-pay | Admitting: Interventional Cardiology

## 2015-11-11 NOTE — Telephone Encounter (Signed)
Please advise on refill request as the patient has not been seen since 2015 and the last two rx's were only for a quantity of fifteen with a note to call and schedule. Patient still has not scheduled an appointment. Thanks, MI

## 2015-11-12 NOTE — Telephone Encounter (Signed)
Needs to come from PCP since we have not seen since 2015.  Thanks!

## 2016-11-20 ENCOUNTER — Other Ambulatory Visit (INDEPENDENT_AMBULATORY_CARE_PROVIDER_SITE_OTHER): Payer: Self-pay | Admitting: Otolaryngology

## 2016-11-23 ENCOUNTER — Other Ambulatory Visit (INDEPENDENT_AMBULATORY_CARE_PROVIDER_SITE_OTHER): Payer: Self-pay | Admitting: Otolaryngology

## 2016-11-23 DIAGNOSIS — H93A9 Pulsatile tinnitus, unspecified ear: Secondary | ICD-10-CM

## 2016-11-24 ENCOUNTER — Other Ambulatory Visit (INDEPENDENT_AMBULATORY_CARE_PROVIDER_SITE_OTHER): Payer: Self-pay | Admitting: Otolaryngology

## 2016-11-24 DIAGNOSIS — H93A9 Pulsatile tinnitus, unspecified ear: Secondary | ICD-10-CM

## 2016-12-12 ENCOUNTER — Other Ambulatory Visit (INDEPENDENT_AMBULATORY_CARE_PROVIDER_SITE_OTHER): Payer: Self-pay | Admitting: Otolaryngology

## 2016-12-12 ENCOUNTER — Other Ambulatory Visit: Payer: 59

## 2016-12-12 ENCOUNTER — Ambulatory Visit
Admission: RE | Admit: 2016-12-12 | Discharge: 2016-12-12 | Disposition: A | Discharge status: Home / Self Care | Payer: 59 | Source: Ambulatory Visit | Attending: Otolaryngology | Admitting: Otolaryngology

## 2016-12-12 DIAGNOSIS — H93A9 Pulsatile tinnitus, unspecified ear: Secondary | ICD-10-CM

## 2016-12-12 MED ORDER — GADOBENATE DIMEGLUMINE 529 MG/ML IV SOLN
20.0000 mL | Freq: Once | INTRAVENOUS | Status: AC | PRN
  Administered 2016-12-12: 20 mL via INTRAVENOUS

## 2016-12-13 ENCOUNTER — Other Ambulatory Visit: Payer: 59

## 2016-12-13 ENCOUNTER — Ambulatory Visit
Admission: RE | Admit: 2016-12-13 | Discharge: 2016-12-13 | Disposition: A | Discharge status: Home / Self Care | Payer: 59 | Source: Ambulatory Visit | Attending: Otolaryngology | Admitting: Otolaryngology

## 2016-12-13 DIAGNOSIS — H93A9 Pulsatile tinnitus, unspecified ear: Secondary | ICD-10-CM

## 2016-12-20 ENCOUNTER — Other Ambulatory Visit (HOSPITAL_COMMUNITY): Payer: Self-pay | Admitting: Interventional Radiology

## 2016-12-20 DIAGNOSIS — I671 Cerebral aneurysm, nonruptured: Secondary | ICD-10-CM

## 2016-12-20 DIAGNOSIS — H93A9 Pulsatile tinnitus, unspecified ear: Secondary | ICD-10-CM

## 2017-01-10 ENCOUNTER — Ambulatory Visit (HOSPITAL_COMMUNITY)
Admission: RE | Admit: 2017-01-10 | Discharge: 2017-01-10 | Disposition: A | Discharge status: Home / Self Care | Payer: 59 | Source: Ambulatory Visit | Attending: Interventional Radiology | Admitting: Interventional Radiology

## 2017-01-10 DIAGNOSIS — H93A9 Pulsatile tinnitus, unspecified ear: Secondary | ICD-10-CM

## 2017-01-10 DIAGNOSIS — I671 Cerebral aneurysm, nonruptured: Secondary | ICD-10-CM

## 2017-01-10 HISTORY — PX: IR RADIOLOGIST EVAL & MGMT: IMG5224

## 2017-01-11 ENCOUNTER — Encounter (HOSPITAL_COMMUNITY): Payer: Self-pay | Admitting: Interventional Radiology

## 2017-01-17 ENCOUNTER — Telehealth (HOSPITAL_COMMUNITY): Payer: Self-pay

## 2017-01-17 NOTE — Telephone Encounter (Signed)
Pt called wanting to know her portion of the angio if she decides to schedule. I called and left her a message with the number for the pre-service center. Told her to choose radiology and give them the CPT code and they could give her that info. Call back if she has any questions. AW

## 2017-04-13 ENCOUNTER — Other Ambulatory Visit: Payer: Self-pay | Admitting: Internal Medicine

## 2017-04-13 DIAGNOSIS — Z139 Encounter for screening, unspecified: Secondary | ICD-10-CM

## 2017-04-20 ENCOUNTER — Ambulatory Visit
Admission: RE | Admit: 2017-04-20 | Discharge: 2017-04-20 | Disposition: A | Discharge status: Home / Self Care | Payer: 59 | Source: Ambulatory Visit | Attending: Internal Medicine | Admitting: Internal Medicine

## 2017-04-20 DIAGNOSIS — Z139 Encounter for screening, unspecified: Secondary | ICD-10-CM

## 2017-04-20 LAB — HM PAP SMEAR: HM Pap smear: NORMAL

## 2017-05-08 ENCOUNTER — Telehealth: Payer: Self-pay | Admitting: Interventional Cardiology

## 2017-05-08 NOTE — Telephone Encounter (Signed)
   [facility] Pre-operative Risk Assessment    Request for surgical clearance:  1. What type of surgery is being performed? Colonoscopy   2. When is this surgery scheduled? 05/17/2017   3. What type of clearance is required (medical clearance vs. Pharmacy clearance to hold med vs. Both)?  Cardiac  4. Are there any medications that need to be held prior to surgery and how long? None listed   5. Practice name and name of physician performing surgery?  Dr. Collene Mares or Dr. Benson Norway with [facility]   6. What is your office phone number? [phone]    7.   What is your office fax number? [phone]  8.   Anesthesia type (None, local, MAC, general) ? Propofol   Erin Barrett L 05/08/2017, 3:36 PM  _________________________________________________________________   (provider comments below)

## 2017-05-08 NOTE — Telephone Encounter (Signed)
   Primary Cardiologist:Henry Nicholes Stairs III, MD  Chart reviewed as part of pre-operative protocol coverage.   Because of Tanaja Sowder's past medical history and time since last visit, he/she will require a follow-up visit in order to better assess preoperative cardiovascular risk.  Pre-op covering staff: - Please schedule appointment and call patient to inform them. - Please contact requesting surgeon's office via preferred method (i.e, phone, fax) to inform them of need for appointment prior to surgery. - Please route this phone note to the provider that sees the patient for surgical clearance.  Richardson Dopp, PA-C  05/08/2017, 3:48 PM

## 2017-05-08 NOTE — Telephone Encounter (Signed)
Left message to schedule New pt appt for pre op clearance. Pt last seen by Dr. Tamala Julian 2015.

## 2017-05-09 NOTE — Telephone Encounter (Signed)
[facility] to inform them that patient has not been seen by our office since 2015 and will need a new patient appointment which will need to push procedure out. Caryl Pina at [facility] is agreeable to contacting patient and letting hr know as well as finding out if patient has a PCP that could give clearance.

## 2017-11-16 ENCOUNTER — Other Ambulatory Visit: Payer: Self-pay | Admitting: Internal Medicine

## 2017-11-23 ENCOUNTER — Other Ambulatory Visit: Payer: Self-pay | Admitting: Nurse Practitioner

## 2017-11-23 DIAGNOSIS — E559 Vitamin D deficiency, unspecified: Secondary | ICD-10-CM

## 2017-11-23 MED ORDER — VITAMIN D (ERGOCALCIFEROL) 1.25 MG (50000 UNIT) PO CAPS: 50000.0000 [IU] | ORAL_CAPSULE | ORAL | 1 refills | Status: DC

## 2017-12-14 ENCOUNTER — Other Ambulatory Visit: Payer: Self-pay | Admitting: Nurse Practitioner

## 2017-12-14 ENCOUNTER — Other Ambulatory Visit: Payer: Self-pay | Admitting: Internal Medicine

## 2017-12-19 ENCOUNTER — Other Ambulatory Visit: Payer: Self-pay | Admitting: Internal Medicine

## 2018-01-01 ENCOUNTER — Other Ambulatory Visit: Payer: Self-pay | Admitting: Internal Medicine

## 2018-01-21 ENCOUNTER — Encounter: Payer: Self-pay | Admitting: Internal Medicine

## 2018-01-21 ENCOUNTER — Ambulatory Visit (INDEPENDENT_AMBULATORY_CARE_PROVIDER_SITE_OTHER): Payer: 59 | Admitting: Internal Medicine

## 2018-01-21 VITALS — BP 120/96 | HR 75 | Temp 98.7°F | Ht 69.0 in | Wt 289.0 lb

## 2018-01-21 DIAGNOSIS — M7062 Trochanteric bursitis, left hip: Secondary | ICD-10-CM | POA: Diagnosis not present

## 2018-01-21 DIAGNOSIS — I110 Hypertensive heart disease with heart failure: Secondary | ICD-10-CM

## 2018-01-21 DIAGNOSIS — Z23 Encounter for immunization: Secondary | ICD-10-CM

## 2018-01-21 DIAGNOSIS — E66813 Obesity, class 3: Secondary | ICD-10-CM

## 2018-01-21 DIAGNOSIS — I5032 Chronic diastolic (congestive) heart failure: Secondary | ICD-10-CM

## 2018-01-21 DIAGNOSIS — Z6841 Body Mass Index (BMI) 40.0 and over, adult: Secondary | ICD-10-CM

## 2018-01-21 LAB — CBC
HEMOGLOBIN: 13.1 g/dL (ref 11.1–15.9)
Hematocrit: 38.2 % (ref 34.0–46.6)
MCH: 31.3 pg (ref 26.6–33.0)
MCHC: 34.3 g/dL (ref 31.5–35.7)
MCV: 91 fL (ref 79–97)
Platelets: 287 10*3/uL (ref 150–450)
RBC: 4.18 x10E6/uL (ref 3.77–5.28)
RDW: 13.2 % (ref 11.7–15.4)
WBC: 5.8 10*3/uL (ref 3.4–10.8)

## 2018-01-21 LAB — CMP14+EGFR
A/G RATIO: 2 (ref 1.2–2.2)
ALK PHOS: 59 IU/L (ref 39–117)
ALT: 15 IU/L (ref 0–32)
AST: 20 IU/L (ref 0–40)
Albumin: 4.3 g/dL (ref 3.5–5.5)
BUN/Creatinine Ratio: 12 (ref 9–23)
BUN: 11 mg/dL (ref 6–24)
Bilirubin Total: 0.4 mg/dL (ref 0.0–1.2)
CALCIUM: 9.6 mg/dL (ref 8.7–10.2)
CO2: 23 mmol/L (ref 20–29)
Chloride: 106 mmol/L (ref 96–106)
Creatinine, Ser: 0.93 mg/dL (ref 0.57–1.00)
GFR calc Af Amer: 82 mL/min/{1.73_m2} (ref >59)
GFR, EST NON AFRICAN AMERICAN: 71 mL/min/{1.73_m2} (ref >59)
Globulin, Total: 2.2 g/dL (ref 1.5–4.5)
Glucose: 78 mg/dL (ref 65–99)
POTASSIUM: 4 mmol/L (ref 3.5–5.2)
Sodium: 144 mmol/L (ref 134–144)
Total Protein: 6.5 g/dL (ref 6.0–8.5)

## 2018-01-21 LAB — LIPID PANEL
CHOLESTEROL TOTAL: 149 mg/dL (ref 100–199)
Chol/HDL Ratio: 3.2 ratio (ref 0.0–4.4)
HDL: 46 mg/dL (ref >39)
LDL CALC: 86 mg/dL (ref 0–99)
TRIGLYCERIDES: 86 mg/dL (ref 0–149)
VLDL Cholesterol Cal: 17 mg/dL (ref 5–40)

## 2018-01-21 LAB — HEMOGLOBIN A1C
Est. average glucose Bld gHb Est-mCnc: 105 mg/dL
Hgb A1c MFr Bld: 5.3 % (ref 4.8–5.6)

## 2018-01-21 MED ORDER — LORCASERIN HCL ER 20 MG PO TB24
20.0000 mg | ORAL_TABLET | Freq: Every morning | ORAL | 1 refills | Status: DC
Start: 2018-01-21 — End: 2018-05-08

## 2018-01-21 NOTE — Patient Instructions (Signed)
Obesity, Adult Obesity is having too much body fat. If you have a BMI of 30 or more, you are obese. BMI is a number that explains how much body fat you have. Obesity is often caused by taking in (consuming) more calories than your body uses. Obesity can cause serious health problems. Changing your lifestyle can help to treat obesity. Follow these instructions at home: Eating and drinking   Follow advice from your doctor about what to eat and drink. Your doctor may tell you to: ? Cut down on (limit) fast foods, sweets, and processed snack foods. ? Choose low-fat options. For example, choose low-fat milk instead of whole milk. ? Eat 5 or more servings of fruits or vegetables every day. ? Eat at home more often. This gives you more control over what you eat. ? Choose healthy foods when you eat out. ? Learn what a healthy portion size is. A portion size is the amount of a certain food that is healthy for you to eat at one time. This is different for each person. ? Keep low-fat snacks available. ? Avoid sugary drinks. These include soda, fruit juice, iced tea that is sweetened with sugar, and flavored milk. ? Eat a healthy breakfast.  Drink enough water to keep your pee (urine) clear or pale yellow.  Do not go without eating for long periods of time (do not fast).  Do not go on popular or trendy diets (fad diets). Physical Activity  Exercise often, as told by your doctor. Ask your doctor: ? What types of exercise are safe for you. ? How often you should exercise.  Warm up and stretch before being active.  Do slow stretching after being active (cool down).  Rest between times of being active. Lifestyle  Limit how much time you spend in front of your TV, computer, or video game system (be less sedentary).  Find ways to reward yourself that do not involve food.  Limit alcohol intake to no more than 1 drink a day for nonpregnant women and 2 drinks a day for men. One drink equals 12 oz  of beer, 5 oz of wine, or 1 oz of hard liquor. General instructions  Keep a weight loss journal. This can help you keep track of: ? The food that you eat. ? The exercise that you do.  Take over-the-counter and prescription medicines only as told by your doctor.  Take vitamins and supplements only as told by your doctor.  Think about joining a support group. Your doctor may be able to help with this.  Keep all follow-up visits as told by your doctor. This is important. Contact a doctor if:  You cannot meet your weight loss goal after you have changed your diet and lifestyle for 6 weeks. This information is not intended to replace advice given to you by your health care provider. Make sure you discuss any questions you have with your health care provider. Document Released: 03/20/2011 Document Revised: 06/03/2015 Document Reviewed: 10/14/2014 Elsevier Interactive Patient Education  2019 [employer]  

## 2018-01-27 NOTE — Progress Notes (Signed)
Subjective:     Patient ID: Erin Barrett , female    DOB: January 29, 1965 , 53 y.o.   MRN: [id number]   Chief Complaint  Patient presents with  . Hypertension  . Obesity    HPI  Hypertension  This is a chronic problem. The current episode started more than 1 year ago. The problem has been gradually improving since onset. The problem is controlled. Pertinent negatives include no blurred vision, chest pain, palpitations or shortness of breath.   She reports compliance with medications.   Past Medical History:  Diagnosis Date  . Cancer ([place])   . Hypertension   . Obesity      Family History  Problem Relation Age of Onset  . CVA Mother   . Heart disease Father      Current Outpatient Medications:  .  amLODipine (NORVASC) 10 MG tablet, , Disp: , Rfl:  .  enalapril (VASOTEC) 20 MG tablet, TAKE 1 TABLET BY MOUTH EVERY DAY, Disp: 30 tablet, Rfl: 0 .  furosemide (LASIX) 40 MG tablet, Take 1 tablet (40 mg total) by mouth daily., Disp: , Rfl:  .  hydrALAZINE (APRESOLINE) 50 MG tablet, Take 50 mg by mouth 3 (three) times daily., Disp: , Rfl:  .  labetalol (NORMODYNE) 200 MG tablet, TAKE 2 TABLETS TWICE A DAY, Disp: 360 tablet, Rfl: 5 .  Liraglutide -Weight Management (SAXENDA) 18 MG/3ML SOPN, INJECT 3MG, SUB-Q, EVERYDAY IN ABDOMEN, THIGH OR UPPER ARM.YSL, Disp: , Rfl:  .  Vitamin D, Ergocalciferol, (DRISDOL) 1.25 MG (50000 UT) CAPS capsule, Take 1 capsule (50,000 Units total) by mouth 2 (two) times a week., Disp: 24 capsule, Rfl: 1 .  BD PEN NEEDLE NANO U/F 32G X 4 MM MISC, USE ONCE PER DAY, Disp: 100 each, Rfl: 11 .  KLOR-CON M20 20 MEQ tablet, TAKE 1 TABLET (20 MEQ TOTAL) BY MOUTH DAILY. (Patient not taking: Reported on 01/21/2018), Disp: 15 tablet, Rfl: 0 .  Lorcaserin HCl ER (BELVIQ XR) 20 MG TB24, Take 20 mg by mouth every morning., Disp: 30 tablet, Rfl: 1   No Known Allergies   Review of Systems  Constitutional: Negative.   Eyes: Negative for blurred vision.  Respiratory:  Negative.  Negative for shortness of breath.   Cardiovascular: Negative.  Negative for chest pain and palpitations.  Gastrointestinal: Negative.   Musculoskeletal: Positive for arthralgias (she c/o l hip pain. there is some pain w/ ambulation).  Neurological: Negative.   Psychiatric/Behavioral: Negative.      Today's Vitals   01/21/18 0910  BP: (!) 120/96  Pulse: 75  Temp: 98.7 F (37.1 C)  TempSrc: Oral  Weight: 289 lb (131.1 kg)  Height: 5' 9"  (1.753 m)   Body mass index is 42.68 kg/m.   Objective:  Physical Exam Vitals signs and nursing note reviewed.  Constitutional:      Appearance: Normal appearance. She is obese.  HENT:     Head: Normocephalic and atraumatic.  Cardiovascular:     Rate and Rhythm: Normal rate and regular rhythm.     Heart sounds: Normal heart sounds.  Pulmonary:     Effort: Pulmonary effort is normal.     Breath sounds: Normal breath sounds.  Skin:    General: Skin is warm.  Neurological:     General: No focal deficit present.     Mental Status: She is alert.         Assessment And Plan:     1. Hypertensive heart disease with chronic diastolic congestive heart  failure ([place])  Fair control. She is encouraged to avoid adding salt to her foods. BP possibly exacerbated by left hip pain. I will also refer her to cardiology for f/u heart failure. She admits that she has not been seen in over a year.   - Ambulatory referral to Cardiology - CMP14+EGFR - CBC - Lipid panel - Hemoglobin A1c  2. Trochanteric bursitis of left hip  She is encouraged to consider topical pain cream for prn use. She plans to return to Ortho for further evaluation.   3. Class 3 severe obesity due to excess calories with serious comorbidity and body mass index (BMI) of 45.0 to 49.9 in adult [facility])  She is encouraged to strive for BMI less than 38 to decrease cardiac risk. She is encouraged to resume regular exercise, but due to her orthopedic issues, she would likely do  better with aqua exercises.   4. Needs flu shot  She was given flu vaccine to update her immunizations.   [name], MD

## 2018-01-31 ENCOUNTER — Encounter: Payer: Self-pay | Admitting: Internal Medicine

## 2018-02-01 ENCOUNTER — Telehealth: Payer: Self-pay

## 2018-02-01 NOTE — Telephone Encounter (Signed)
Pt and pharmacy notified of approval of belviq

## 2018-02-11 ENCOUNTER — Other Ambulatory Visit: Payer: Self-pay | Admitting: Internal Medicine

## 2018-02-14 ENCOUNTER — Other Ambulatory Visit: Payer: Self-pay | Admitting: Internal Medicine

## 2018-02-14 DIAGNOSIS — I5032 Chronic diastolic (congestive) heart failure: Secondary | ICD-10-CM

## 2018-02-26 ENCOUNTER — Telehealth: Payer: Self-pay | Admitting: Internal Medicine

## 2018-02-26 NOTE — Telephone Encounter (Signed)
ATT TO CONTACT PT TO ADVISE THAT SHE SHOULD STOP TAKING BELVIQ DUE TO SHOWN TO CAUSE CANCER. NO ANS LVM

## 2018-02-28 ENCOUNTER — Encounter: Payer: Self-pay | Admitting: Internal Medicine

## 2018-03-01 ENCOUNTER — Encounter: Payer: Self-pay | Admitting: Internal Medicine

## 2018-03-04 ENCOUNTER — Other Ambulatory Visit: Payer: Self-pay | Admitting: Internal Medicine

## 2018-03-04 MED ORDER — NALTREXONE-BUPROPION HCL ER 8-90 MG PO TB12: 8.0000 mg | ORAL_TABLET | Freq: Two times a day (BID) | ORAL | 1 refills | Status: DC

## 2018-03-08 ENCOUNTER — Encounter: Payer: Self-pay | Admitting: Internal Medicine

## 2018-03-12 ENCOUNTER — Telehealth: Payer: Self-pay | Admitting: *Deleted

## 2018-03-12 NOTE — Telephone Encounter (Signed)
   Primary Cardiologist:Henry Nicholes Stairs III, MD  Chart reviewed as part of pre-operative protocol coverage. Pt has not been seen in our office since 2015. If he needs cardiac clearance he will need to be seen as a new patient.   He may be able to be cleared by his PCP since he is apparently not having active cardiac issues.   Pre-op covering staff: - Please contact requesting surgeon's office via preferred method (i.e, phone, fax) to inform them of need for appointment prior to surgery and see if they need cardiology or if PCP can clear pt.   If applicable, this message will also be routed to pharmacy pool and/or primary cardiologist for input on holding anticoagulant/antiplatelet agent as requested below so that this information is available at time of patient's appointment.   Daune Perch, NP  03/12/2018, 12:09 PM

## 2018-03-12 NOTE — Telephone Encounter (Signed)
   [facility] Pre-operative Risk Assessment    Request for surgical clearance:  1. What type of surgery is being performed? COLONOSCOPY   2. When is this surgery scheduled? TBD   3. What type of clearance is required (medical clearance vs. Pharmacy clearance to hold med vs. Both)? TBD  4. Are there any medications that need to be held prior to surgery and how long?NONE LISTED   5. Practice name and name of physician performing surgery? [facility] DR. MANN   6. What is your office phone number [phone]    7.   What is your office fax number [phone]  8.   Anesthesia type (None, local, MAC, general) ? PROPOFOL    Julaine Hua 03/12/2018, 11:40 AM  _________________________________________________________________   (provider comments below)

## 2018-03-12 NOTE — Telephone Encounter (Signed)
Pt is scheduled to see Dr. Tamala Julian on 03/20/2018

## 2018-03-13 ENCOUNTER — Telehealth: Payer: Self-pay

## 2018-03-13 NOTE — Telephone Encounter (Signed)
Left the pt a message that the prior authorization rep said that she is getting to the Contrave prior authorizations and that the blood pressure recalls has her behind some.

## 2018-03-18 ENCOUNTER — Encounter: Payer: Self-pay | Admitting: Internal Medicine

## 2018-03-19 ENCOUNTER — Ambulatory Visit: Payer: 59 | Admitting: Internal Medicine

## 2018-03-19 NOTE — Progress Notes (Signed)
Cardiology Office Note:    Date:  03/20/2018   ID:  Glyn Zendejas, DOB [birth year], MRN [id number]  PCP:  Glendale Chard, MD  Cardiologist:  Sinclair Grooms, MD   Referring MD: Glendale Chard, MD   Chief Complaint  Patient presents with  . Congestive Heart Failure  . Hypertension    History of Present Illness:    Erin Barrett is a 53 y.o. female with a hx of chronic dyspnea, morbid obesity, and now seeking surgical clearance for colonoscopy.  Erin Barrett is doing well.  She is more active than most, however she has begun gaining weight again.  She walks 2 flights of stairs multiple times per week as she lives on the second floor of her apartment building.  She has consistent aerobic activity including water aerobics, Zumba, and other physical activities.  She is not noticed a change in endurance.  She denies chest discomfort.  She has not had angina, orthopnea, PND, lower extremity swelling, palpitations, or syncope.  She has CPAP for obstructive sleep apnea but is not compliant with therapy.  He is concerned about increasing weight.  She is looking forward to undergoing colonoscopy by Dr. Benson Norway, and clearance for such procedure is requested.  Past Medical History:  Diagnosis Date  . Cancer ([place])   . Hypertension   . Obesity     Past Surgical History:  Procedure Laterality Date  . CESAREAN SECTION    . IR RADIOLOGIST EVAL & MGMT  01/10/2017    Current Medications: Current Meds  Medication Sig  . amLODipine (NORVASC) 10 MG tablet   . enalapril (VASOTEC) 20 MG tablet TAKE 1 TABLET BY MOUTH EVERY DAY  . furosemide (LASIX) 40 MG tablet TAKE 1 TABLET BY MOUTH EVERY DAY  . hydrALAZINE (APRESOLINE) 50 MG tablet TAKE 1 TABLET 3 TIMES A DAY WITH FOOD  . KLOR-CON M20 20 MEQ tablet TAKE 1 TABLET (20 MEQ TOTAL) BY MOUTH DAILY.  Marland Kitchen labetalol (NORMODYNE) 200 MG tablet TAKE 2 TABLETS TWICE A DAY  . Lorcaserin HCl ER (BELVIQ XR) 20 MG TB24 Take 20 mg by mouth every morning.  .  Naltrexone-buPROPion HCl ER (CONTRAVE) 8-90 MG TB12 Take 8-90 mg by mouth 2 (two) times daily.  . Vitamin D, Ergocalciferol, (DRISDOL) 1.25 MG (50000 UT) CAPS capsule Take 1 capsule (50,000 Units total) by mouth 2 (two) times a week.     Allergies:   Patient has no known allergies.   Social History   Socioeconomic History  . Marital status: Legally Separated    Spouse name: Not on file  . Number of children: Not on file  . Years of education: Not on file  . Highest education level: Not on file  Occupational History  . Not on file  Social Needs  . Financial resource strain: Not on file  . Food insecurity:    Worry: Not on file    Inability: Not on file  . Transportation needs:    Medical: Not on file    Non-medical: Not on file  Tobacco Use  . Smoking status: Never Smoker  . Smokeless tobacco: Never Used  Substance and Sexual Activity  . Alcohol use: Yes  . Drug use: No  . Sexual activity: Not on file  Lifestyle  . Physical activity:    Days per week: Not on file    Minutes per session: Not on file  . Stress: Not on file  Relationships  . Social connections:    Talks on phone: Not  on file    Gets together: Not on file    Attends religious service: Not on file    Active member of club or organization: Not on file    Attends meetings of clubs or organizations: Not on file    Relationship status: Not on file  Other Topics Concern  . Not on file  Social History Narrative  . Not on file     Family History: The patient's family history includes CVA in her mother; Heart disease in her father.  ROS:   Please see the history of present illness.    Bursitis left hip.  Alcohol consumption.  Stress related to her job.  Will smoke a cigarette on occasion when she is drinking.  All other systems reviewed and are negative.  EKGs/Labs/Other Studies Reviewed:    The following studies were reviewed today: 2D Doppler echocardiogram April 2015: Study Conclusions  - Left  ventricle: The cavity size was normal. There was moderate concentric hypertrophy. Systolic function was normal. The estimated ejection fraction was in the range of 55% to 60%. Wall motion was normal; there were no regional wall motion abnormalities. Doppler parameters are consistent with pseudonormal left ventricular relaxation (grade2 diastolic dysfunction). The E/A ratio is 1.5. The E/e' ratio is >15, suggesting elevated LV filling pressure. - Left atrium: Moderately dilated (38 ml/m2). - Right ventricle: The cavity size was normal. Wall thickness was normal. Systolic function was normal. RV systolic pressure: 23FT Hg (S, est). - Right atrium: Moderately dilated (23 cm2). - Atrial septum: No defect or patent foramen ovale was identified. - Tricuspid valve: Mild regurgitation. - Inferior vena cava: The vessel was normal in size; the respirophasic diameter changes were in the normal range (= 50%); findings are consistent with normal central venous pressure. - Pericardium, extracardiac: There was no pericardial effusion.  EKG:  EKG normal sinus rhythm with nonspecific T wave flattening.  When compared to prior, no significant changes occurred other than heart rate is currently slower.  Recent Labs: 01/21/2018: ALT 15; BUN 11; Creatinine, Ser 0.93; Hemoglobin 13.1; Platelets 287; Potassium 4.0; Sodium 144  Recent Lipid Panel    Component Value Date/Time   CHOL 149 01/21/2018 1009   TRIG 86 01/21/2018 1009   HDL 46 01/21/2018 1009   CHOLHDL 3.2 01/21/2018 1009   CHOLHDL 3.8 12/04/2009 0816   VLDL 14 12/04/2009 0816   LDLCALC 86 01/21/2018 1009    Physical Exam:    VS:  BP 124/84   Pulse 65   Ht 5\' 9"  (1.753 m)   Wt 297 lb 12.8 oz (135.1 kg)   LMP 04/29/2011   SpO2 95%   BMI 43.98 kg/m     Wt Readings from Last 3 Encounters:  03/20/18 297 lb 12.8 oz (135.1 kg)  01/21/18 289 lb (131.1 kg)  05/05/13 (!) 385 lb 6.4 oz (174.8 kg)     GEN:  Moderate obesity. No acute distress HEENT: Normal NECK: No JVD. LYMPHATICS: No lymphadenopathy CARDIAC: RRR.  No murmur, S4 gallop, no edema VASCULAR: 2+ bilateral radial pulses, no bruits RESPIRATORY:  Clear to auscultation without rales, wheezing or rhonchi  ABDOMEN: Soft, non-tender, non-distended, No pulsatile mass, MUSCULOSKELETAL: No deformity  SKIN: Warm and dry NEUROLOGIC:  Alert and oriented x 3 PSYCHIATRIC:  Normal affect   ASSESSMENT:    1. Chronic diastolic (congestive) heart failure ([place])   2. Morbid obesity ([place])   3. Preoperative clearance   4. Hypertension, unspecified type   5. Obstructive sleep apnea  PLAN:    In order of problems listed above:  1. No evidence of volume overload or active heart failure.  She has chronic diastolic dysfunction with current excellent blood pressure control and no evidence of decompensation. 2. She is physically active.  Her weight is still below her previous high of 385 pounds in 2015.  She is gradually gaining weight with a 10 pound weight gain in 2 months.  She is now back to watching calories and increasing exercise. 3. She is cleared for the upcoming colonoscopy.  I would caution that cardiac risk would seem to be very low.  She does have sleep apnea and airway protection with sedation will be important. 4. Low-salt diet, restriction on alcohol intake, restriction on nonsteroidal anti-inflammatory agents all discussed. 5. Encouraged her to be compliant with CPAP.  Overall education and awareness concerning primary/secondary risk prevention was discussed in detail: LDL less than 70, hemoglobin A1c less than 7, blood pressure target less than 130/80 mmHg, >150 minutes of moderate aerobic activity per week, avoidance of smoking, weight control (via diet and exercise), and continued surveillance/management of/for obstructive sleep apnea.  Lipids on no therapy are quite acceptable and do not need management.   Medication  Adjustments/Labs and Tests Ordered: Current medicines are reviewed at length with the patient today.  Concerns regarding medicines are outlined above.  Orders Placed This Encounter  Procedures  . EKG 12-Lead   No orders of the defined types were placed in this encounter.   Patient Instructions  Medication Instructions:  Your physician recommends that you continue on your current medications as directed. Please refer to the Current Medication list given to you today.  If you need a refill on your cardiac medications before your next appointment, please call your pharmacy.   Lab work: None If you have labs (blood work) drawn today and your tests are completely normal, you will receive your results only by: Marland Kitchen MyChart Message (if you have MyChart) OR . A paper copy in the mail If you have any lab test that is abnormal or we need to change your treatment, we will call you to review the results.  Testing/Procedures: None  Follow-Up: Your physician recommends that you schedule a follow-up appointment as needed with Dr. Tamala Julian.    Any Other Special Instructions Will Be Listed Below (If Applicable).  You have been cleared for your Colonoscopy.    Your provider recommends that you maintain 150 minutes per week of moderate aerobic activity.       Signed, Sinclair Grooms, MD  03/20/2018 10:42 AM    [place]

## 2018-03-20 ENCOUNTER — Ambulatory Visit (INDEPENDENT_AMBULATORY_CARE_PROVIDER_SITE_OTHER): Payer: 59 | Admitting: Interventional Cardiology

## 2018-03-20 ENCOUNTER — Encounter: Payer: Self-pay | Admitting: Interventional Cardiology

## 2018-03-20 ENCOUNTER — Other Ambulatory Visit: Payer: Self-pay

## 2018-03-20 VITALS — BP 124/84 | HR 65 | Ht 69.0 in | Wt 297.8 lb

## 2018-03-20 DIAGNOSIS — I10 Essential (primary) hypertension: Secondary | ICD-10-CM | POA: Diagnosis not present

## 2018-03-20 DIAGNOSIS — Z01818 Encounter for other preprocedural examination: Secondary | ICD-10-CM

## 2018-03-20 DIAGNOSIS — I5032 Chronic diastolic (congestive) heart failure: Secondary | ICD-10-CM

## 2018-03-20 DIAGNOSIS — G4733 Obstructive sleep apnea (adult) (pediatric): Secondary | ICD-10-CM | POA: Insufficient documentation

## 2018-03-20 NOTE — Patient Instructions (Signed)
Medication Instructions:  Your physician recommends that you continue on your current medications as directed. Please refer to the Current Medication list given to you today.  If you need a refill on your cardiac medications before your next appointment, please call your pharmacy.   Lab work: None If you have labs (blood work) drawn today and your tests are completely normal, you will receive your results only by: Marland Kitchen MyChart Message (if you have MyChart) OR . A paper copy in the mail If you have any lab test that is abnormal or we need to change your treatment, we will call you to review the results.  Testing/Procedures: None  Follow-Up: Your physician recommends that you schedule a follow-up appointment as needed with Dr. Tamala Julian.    Any Other Special Instructions Will Be Listed Below (If Applicable).  You have been cleared for your Colonoscopy.    Your provider recommends that you maintain 150 minutes per week of moderate aerobic activity.

## 2018-03-22 ENCOUNTER — Telehealth: Payer: Self-pay

## 2018-03-22 NOTE — Telephone Encounter (Signed)
PA STARTED FOR CONTRAVE

## 2018-03-25 ENCOUNTER — Telehealth: Payer: Self-pay

## 2018-03-25 NOTE — Telephone Encounter (Signed)
PA for contrave approved

## 2018-05-08 ENCOUNTER — Encounter: Payer: Self-pay | Admitting: Internal Medicine

## 2018-05-08 ENCOUNTER — Other Ambulatory Visit: Payer: Self-pay

## 2018-05-08 ENCOUNTER — Ambulatory Visit (INDEPENDENT_AMBULATORY_CARE_PROVIDER_SITE_OTHER): Payer: 59 | Admitting: Internal Medicine

## 2018-05-08 VITALS — BP 110/78 | HR 83 | Temp 98.9°F | Ht 66.6 in | Wt 295.6 lb

## 2018-05-08 DIAGNOSIS — G4733 Obstructive sleep apnea (adult) (pediatric): Secondary | ICD-10-CM | POA: Diagnosis not present

## 2018-05-08 DIAGNOSIS — Z0000 Encounter for general adult medical examination without abnormal findings: Secondary | ICD-10-CM

## 2018-05-08 DIAGNOSIS — I5032 Chronic diastolic (congestive) heart failure: Secondary | ICD-10-CM | POA: Diagnosis not present

## 2018-05-08 DIAGNOSIS — I110 Hypertensive heart disease with heart failure: Secondary | ICD-10-CM | POA: Diagnosis not present

## 2018-05-08 DIAGNOSIS — Z6841 Body Mass Index (BMI) 40.0 and over, adult: Secondary | ICD-10-CM

## 2018-05-08 LAB — POCT URINALYSIS DIPSTICK
Bilirubin, UA: NEGATIVE
Blood, UA: NEGATIVE
Glucose, UA: NEGATIVE
Ketones, UA: NEGATIVE
Leukocytes, UA: NEGATIVE
Nitrite, UA: NEGATIVE
Protein, UA: NEGATIVE
Spec Grav, UA: 1.025 (ref 1.010–1.025)
Urobilinogen, UA: 0.2 E.U./dL
pH, UA: 5.5 (ref 5.0–8.0)

## 2018-05-08 LAB — POCT UA - MICROALBUMIN
Albumin/Creatinine Ratio, Urine, POC: <30
Creatinine, POC: 200 mg/dL
Microalbumin Ur, POC: 10 mg/L

## 2018-05-08 MED ORDER — ENALAPRIL MALEATE 20 MG PO TABS: 20.0000 mg | ORAL_TABLET | Freq: Every day | ORAL | 1 refills | Status: DC

## 2018-05-08 MED ORDER — POTASSIUM CHLORIDE CRYS ER 20 MEQ PO TBCR: EXTENDED_RELEASE_TABLET | ORAL | 0 refills | Status: DC

## 2018-05-08 MED ORDER — NALTREXONE-BUPROPION HCL ER 8-90 MG PO TB12: ORAL_TABLET | ORAL | 1 refills | Status: DC

## 2018-05-08 NOTE — Patient Instructions (Signed)

## 2018-05-09 ENCOUNTER — Telehealth: Payer: Self-pay

## 2018-05-09 LAB — HEMOGLOBIN A1C
Est. average glucose Bld gHb Est-mCnc: 114 mg/dL
Hgb A1c MFr Bld: 5.6 % (ref 4.8–5.6)

## 2018-05-09 LAB — BMP8+EGFR
BUN/Creatinine Ratio: 13 (ref 9–23)
BUN: 13 mg/dL (ref 6–24)
CO2: 22 mmol/L (ref 20–29)
Calcium: 9.4 mg/dL (ref 8.7–10.2)
Chloride: 106 mmol/L (ref 96–106)
Creatinine, Ser: 1.04 mg/dL — ABNORMAL HIGH (ref 0.57–1.00)
GFR calc Af Amer: 72 mL/min/{1.73_m2} (ref >59)
GFR calc non Af Amer: 62 mL/min/{1.73_m2} (ref >59)
Glucose: 71 mg/dL (ref 65–99)
Potassium: 4.4 mmol/L (ref 3.5–5.2)
Sodium: 145 mmol/L — ABNORMAL HIGH (ref 134–144)

## 2018-05-09 LAB — TSH: TSH: 0.676 u[IU]/mL (ref 0.450–4.500)

## 2018-05-09 NOTE — Telephone Encounter (Signed)
The pt was told that [facility] said that the pt's name is in their system but that there are no medical records for this patient and that she doesn't have any results on file for a sleep study.  The pt said that she did have the sleep study done there and the place on [address] that sent her over there isn't open anymore.  The pt said that she feels like she needed a new sleep study anyway because her other sleep study was done so long ago and that she has lost a lot of weight since then.

## 2018-05-09 NOTE — Progress Notes (Signed)
Subjective:     Patient ID: Erin Barrett , female    DOB: 1965-02-27 , 53 y.o.   MRN: [id number]   Chief Complaint  Patient presents with  . Annual Exam  . Hypertension    HPI  She is here today for a full physical examination. She is followed by GYN for her pelvic exams. She has no specific concerns or complaints at this time.   Hypertension  This is a chronic problem. The current episode started more than 1 year ago. The problem has been gradually improving since onset. The problem is controlled. Pertinent negatives include no blurred vision, chest pain, palpitations or shortness of breath. Identifiable causes of hypertension include sleep apnea.     Past Medical History:  Diagnosis Date  . Cancer ([place])   . Hypertension   . Obesity      Family History  Problem Relation Age of Onset  . CVA Mother   . Heart disease Father      Current Outpatient Medications:  .  amLODipine (NORVASC) 10 MG tablet, , Disp: , Rfl:  .  enalapril (VASOTEC) 20 MG tablet, Take 1 tablet (20 mg total) by mouth daily., Disp: 90 tablet, Rfl: 1 .  furosemide (LASIX) 40 MG tablet, TAKE 1 TABLET BY MOUTH EVERY DAY, Disp: 90 tablet, Rfl: 1 .  hydrALAZINE (APRESOLINE) 50 MG tablet, TAKE 1 TABLET 3 TIMES A DAY WITH FOOD, Disp: 270 tablet, Rfl: 2 .  labetalol (NORMODYNE) 200 MG tablet, TAKE 2 TABLETS TWICE A DAY, Disp: 360 tablet, Rfl: 5 .  Naltrexone-buPROPion HCl ER (CONTRAVE) 8-90 MG TB12, 2 tabs po twice daily, Disp: 120 tablet, Rfl: 1 .  potassium chloride SA (KLOR-CON M20) 20 MEQ tablet, TAKE 1 TABLE  BY MOUTH DAILY., Disp: 90 tablet, Rfl: 0 .  Vitamin D, Ergocalciferol, (DRISDOL) 1.25 MG (50000 UT) CAPS capsule, Take 1 capsule (50,000 Units total) by mouth 2 (two) times a week., Disp: 24 capsule, Rfl: 1   No Known Allergies    The patient states she uses none for birth control. Last LMP was Patient's last menstrual period was 04/29/2011.. Negative for DysmenorrheaNegative for: breast discharge,  breast lump(s), breast pain and breast self exam. Associated symptoms include abnormal vaginal bleeding. Pertinent negatives include abnormal bleeding (hematology), anxiety, decreased libido, depression, difficulty falling sleep, dyspareunia, history of infertility, nocturia, sexual dysfunction, sleep disturbances, urinary incontinence, urinary urgency, vaginal discharge and vaginal itching. Diet regular.The patient states her exercise level is  moderate.  . The patient's tobacco use is:  Social History   Tobacco Use  Smoking Status Never Smoker  Smokeless Tobacco Never Used  . She has been exposed to passive smoke. The patient's alcohol use is:  Social History   Substance and Sexual Activity  Alcohol Use Yes   Review of Systems  Constitutional: Negative.   HENT: Negative.   Eyes: Negative.  Negative for blurred vision.  Respiratory: Negative.  Negative for shortness of breath.   Cardiovascular: Negative.  Negative for chest pain and palpitations.  Gastrointestinal: Negative.   Endocrine: Negative.   Genitourinary: Negative.   Musculoskeletal: Negative.   Skin: Negative.   Allergic/Immunologic: Negative.   Neurological: Negative.   Hematological: Negative.   Psychiatric/Behavioral: Negative.      Today's Vitals   05/08/18 0926  BP: 110/78  Pulse: 83  Temp: 98.9 F (37.2 C)  TempSrc: Oral  Weight: 295 lb 9.6 oz (134.1 kg)  Height: 5' 6.6" (1.692 m)   Body mass index is 46.86 kg/m.  Objective:  Physical Exam Vitals signs and nursing note reviewed.  Constitutional:      Appearance: Normal appearance.  HENT:     Head: Normocephalic and atraumatic.     Right Ear: Tympanic membrane, ear canal and external ear normal.     Left Ear: Tympanic membrane, ear canal and external ear normal.     Nose: Nose normal.     Mouth/Throat:     Mouth: Mucous membranes are moist.     Pharynx: Oropharynx is clear.  Eyes:     Extraocular Movements: Extraocular movements intact.      Conjunctiva/sclera: Conjunctivae normal.     Pupils: Pupils are equal, round, and reactive to light.  Neck:     Musculoskeletal: Normal range of motion and neck supple.  Cardiovascular:     Rate and Rhythm: Normal rate and regular rhythm.     Pulses: Normal pulses.     Heart sounds: Normal heart sounds.  Pulmonary:     Effort: Pulmonary effort is normal.     Breath sounds: Normal breath sounds.  Chest:     Breasts:        Right: Normal. No swelling, bleeding, inverted nipple, mass, nipple discharge, skin change or tenderness.        Left: Normal. No swelling, bleeding, inverted nipple, mass, nipple discharge, skin change or tenderness.  Abdominal:     General: Bowel sounds are normal.     Palpations: Abdomen is soft.     Comments: obese  Genitourinary:    Comments: deferred Musculoskeletal: Normal range of motion.     Comments: Crepitus right knee  Skin:    General: Skin is warm and dry.  Neurological:     General: No focal deficit present.     Mental Status: She is alert and oriented to person, place, and time.  Psychiatric:        Mood and Affect: Mood normal.        Behavior: Behavior normal.         Assessment And Plan:     1. Routine general medical examination at health care facility  A full exam was performed.  Importance of monthly self breast exams was discussed with the patient. PATIENT HAS BEEN ADVISED TO GET 30-45 MINUTES REGULAR EXERCISE NO LESS THAN FOUR TO FIVE DAYS PER WEEK - BOTH WEIGHTBEARING EXERCISES AND AEROBIC ARE RECOMMENDED.  SHE WAS ADVISED TO FOLLOW A HEALTHY DIET WITH AT LEAST SIX FRUITS/VEGGIES PER DAY, DECREASE INTAKE OF RED MEAT, AND TO INCREASE FISH INTAKE TO TWO DAYS PER WEEK.  MEATS/FISH SHOULD NOT BE FRIED, BAKED OR BROILED IS PREFERABLE.  I SUGGEST WEARING SPF 50 SUNSCREEN ON EXPOSED PARTS AND ESPECIALLY WHEN IN THE DIRECT SUNLIGHT FOR AN EXTENDED PERIOD OF TIME.  PLEASE AVOID FAST FOOD RESTAURANTS AND INCREASE YOUR WATER INTAKE.  - TSH -  Hemoglobin A1c - BMP8+EGFR  2. Hypertensive heart disease with chronic diastolic congestive heart failure ([place])  Well controlled. She will continue with current meds. She is encouraged to limit her salt intake. EKG from recent visit with Cardiology was reviewed. She will rto in six months for re-evaluation.   - POCT Urinalysis Dipstick (81002) - POCT UA - Microalbumin  3. Obstructive sleep apnea  Chronic. We discussed importance of CPAP compliance. She admits that she has not been using regularly b/c she needs new CPAP supplies. I will send order to [place]. She is aware that a repeat study may be necessary since her last study was many  years ago.   4. Class 3 severe obesity due to excess calories with serious comorbidity and body mass index (BMI) of 45.0 to 49.9 in adult [facility])  She has been taking Contrave 2 capsules in am. She will now increase her does to 2 capsules in am, and 1 capsule in the afternoon x 2 weeks, then 2 capsules po twice daily. I will also [facility] to [facility] for more information regarding meal planning, etc. She is in agreement with her treatment plan. She will rto in 8 weeks for re-evaluation.       [name], MD    THE PATIENT IS ENCOURAGED TO PRACTICE SOCIAL DISTANCING DUE TO THE COVID-19 PANDEMIC.

## 2018-05-17 ENCOUNTER — Other Ambulatory Visit: Payer: Self-pay | Admitting: Nurse Practitioner

## 2018-05-17 DIAGNOSIS — E559 Vitamin D deficiency, unspecified: Secondary | ICD-10-CM

## 2018-05-18 ENCOUNTER — Other Ambulatory Visit: Payer: Self-pay | Admitting: Internal Medicine

## 2018-06-18 ENCOUNTER — Encounter: Payer: Self-pay | Admitting: Internal Medicine

## 2018-07-04 ENCOUNTER — Encounter: Payer: Self-pay | Admitting: Internal Medicine

## 2018-07-04 ENCOUNTER — Other Ambulatory Visit: Payer: Self-pay

## 2018-07-04 ENCOUNTER — Ambulatory Visit (INDEPENDENT_AMBULATORY_CARE_PROVIDER_SITE_OTHER): Payer: 59 | Admitting: Internal Medicine

## 2018-07-04 VITALS — BP 116/84 | HR 76 | Temp 98.9°F | Ht 66.6 in | Wt 299.8 lb

## 2018-07-04 DIAGNOSIS — W0110XA Fall on same level from slipping, tripping and stumbling with subsequent striking against unspecified object, initial encounter: Secondary | ICD-10-CM | POA: Diagnosis not present

## 2018-07-04 DIAGNOSIS — M25512 Pain in left shoulder: Secondary | ICD-10-CM | POA: Diagnosis not present

## 2018-07-04 DIAGNOSIS — Z6841 Body Mass Index (BMI) 40.0 and over, adult: Secondary | ICD-10-CM | POA: Diagnosis not present

## 2018-07-04 DIAGNOSIS — W19XXXA Unspecified fall, initial encounter: Secondary | ICD-10-CM

## 2018-07-04 MED ORDER — PHENTERMINE HCL 15 MG PO CAPS: 15.0000 mg | ORAL_CAPSULE | ORAL | 1 refills | Status: DC

## 2018-07-04 NOTE — Patient Instructions (Signed)
PLEASE GO TO [facility]  NON-SURGICAL ARM -- DO ORIENTATION CLASS ONLINE  PHENTERMINE 15MG  DAILY, DO NOT TAKE AFTER 10AM  HOW TO WEAN OFF OF CONTRAVE:  CUT BACK TO ONE TABLET DAILY X 7 DAYS, THEN SKIP ONE DAY  AFTER THAT, YOU MAY START THE PHENTERMINE

## 2018-07-06 NOTE — Progress Notes (Signed)
Subjective:     Patient ID: Erin Barrett , female    DOB: 1965-03-18 , 53 y.o.   MRN: [id number]   Chief Complaint  Patient presents with  . Fall    HPI  She is here today for further evaluation after a fall. She reports she was walking her dog in the woods and tripped over a branch. She reports she fell onto her left shoulder. There is pain with movement; however, she is able to move her arm freely.   Fall The accident occurred more than 1 week ago. The fall occurred while walking. She landed on dirt. The point of impact was the left shoulder.     Past Medical History:  Diagnosis Date  . Cancer ([place])   . Hypertension   . Obesity      Family History  Problem Relation Age of Onset  . CVA Mother   . Heart disease Father      Current Outpatient Medications:  .  amLODipine (NORVASC) 10 MG tablet, TAKE 1 TABLET BY MOUTH EVERY DAY, Disp: 90 tablet, Rfl: 1 .  enalapril (VASOTEC) 20 MG tablet, Take 1 tablet (20 mg total) by mouth daily., Disp: 90 tablet, Rfl: 1 .  furosemide (LASIX) 40 MG tablet, TAKE 1 TABLET BY MOUTH EVERY DAY, Disp: 90 tablet, Rfl: 1 .  hydrALAZINE (APRESOLINE) 50 MG tablet, TAKE 1 TABLET 3 TIMES A DAY WITH FOOD, Disp: 270 tablet, Rfl: 2 .  labetalol (NORMODYNE) 200 MG tablet, TAKE 2 TABLETS TWICE A DAY, Disp: 360 tablet, Rfl: 5 .  Naltrexone-buPROPion HCl ER (CONTRAVE) 8-90 MG TB12, 2 tabs po twice daily, Disp: 120 tablet, Rfl: 1 .  potassium chloride SA (KLOR-CON M20) 20 MEQ tablet, TAKE 1 TABLE  BY MOUTH DAILY., Disp: 90 tablet, Rfl: 0 .  Vitamin D, Ergocalciferol, (DRISDOL) 1.25 MG (50000 UT) CAPS capsule, TAKE 1 CAPSULE (50,000 UNITS TOTAL) BY MOUTH 2 (TWO) TIMES A WEEK., Disp: 24 capsule, Rfl: 1 .  phentermine 15 MG capsule, Take 1 capsule (15 mg total) by mouth every morning., Disp: 30 capsule, Rfl: 1   No Known Allergies   Review of Systems  Constitutional: Negative.   Respiratory: Negative.   Cardiovascular: Negative.   Gastrointestinal:  Negative.   Musculoskeletal: Positive for arthralgias.  Neurological: Negative.   Psychiatric/Behavioral: Negative.      Today's Vitals   07/04/18 1150  BP: 116/84  Pulse: 76  Temp: 98.9 F (37.2 C)  TempSrc: Oral  Weight: 299 lb 12.8 oz (136 kg)  Height: 5' 6.6" (1.692 m)  PainSc: 5   PainLoc: Shoulder   Body mass index is 47.52 kg/m.   Objective:  Physical Exam Vitals signs and nursing note reviewed.  Constitutional:      Appearance: Normal appearance.  HENT:     Head: Normocephalic and atraumatic.  Cardiovascular:     Rate and Rhythm: Normal rate and regular rhythm.     Heart sounds: Normal heart sounds.  Pulmonary:     Effort: Pulmonary effort is normal.     Breath sounds: Normal breath sounds.  Musculoskeletal:     Comments: Shoulders have full ROM, but pain w/ movement.   Skin:    General: Skin is warm.  Neurological:     General: No focal deficit present.     Mental Status: She is alert.  Psychiatric:        Mood and Affect: Mood normal.        Behavior: Behavior normal.  Assessment And Plan:     1. Fall, initial encounter  She is encouraged to pay close attention to the ground when walking in the woods. I do not believe radiographic studies are needed at this time.   2. Acute shoulder pain, left  S/p fall. She is encouraged to start magnesium nightly.   3. Class 3 severe obesity due to excess calories with serious comorbidity and body mass index (BMI) of 45.0 to 49.9 in adult [facility])  She requests to come off of Contrave. She was given instructions on how to wean off of this medication. She will then transition to phentermine, 15mg  daily. She agrees to rto in six to eight weeks for re-evaluation.  She is also encouraged to look into [facility] program (originally referred to [facility], she has yet to hear from them). She is advised to complete online orientation and then schedule appt for non-surgical arm of their program.   [name], MD    THE PATIENT IS ENCOURAGED TO PRACTICE SOCIAL DISTANCING DUE TO THE COVID-19 PANDEMIC.

## 2018-07-08 ENCOUNTER — Other Ambulatory Visit: Payer: Self-pay

## 2018-07-18 ENCOUNTER — Other Ambulatory Visit: Payer: Self-pay | Admitting: Internal Medicine

## 2018-07-29 ENCOUNTER — Other Ambulatory Visit: Payer: Self-pay | Admitting: Internal Medicine

## 2018-08-14 ENCOUNTER — Other Ambulatory Visit: Payer: Self-pay | Admitting: Internal Medicine

## 2018-08-14 DIAGNOSIS — I5032 Chronic diastolic (congestive) heart failure: Secondary | ICD-10-CM

## 2018-08-16 ENCOUNTER — Encounter: Payer: Self-pay | Admitting: Internal Medicine

## 2018-08-26 ENCOUNTER — Encounter: Payer: Self-pay | Admitting: Internal Medicine

## 2018-08-26 ENCOUNTER — Other Ambulatory Visit: Payer: Self-pay

## 2018-08-26 ENCOUNTER — Ambulatory Visit (INDEPENDENT_AMBULATORY_CARE_PROVIDER_SITE_OTHER): Payer: 59 | Admitting: Internal Medicine

## 2018-08-26 VITALS — BP 144/96 | HR 102 | Temp 98.8°F | Ht 66.6 in | Wt 299.8 lb

## 2018-08-26 DIAGNOSIS — I110 Hypertensive heart disease with heart failure: Secondary | ICD-10-CM

## 2018-08-26 DIAGNOSIS — M25511 Pain in right shoulder: Secondary | ICD-10-CM | POA: Diagnosis not present

## 2018-08-26 DIAGNOSIS — Z6841 Body Mass Index (BMI) 40.0 and over, adult: Secondary | ICD-10-CM

## 2018-08-26 DIAGNOSIS — F4323 Adjustment disorder with mixed anxiety and depressed mood: Secondary | ICD-10-CM

## 2018-08-26 DIAGNOSIS — I5032 Chronic diastolic (congestive) heart failure: Secondary | ICD-10-CM

## 2018-08-26 DIAGNOSIS — Z5689 Other problems related to employment: Secondary | ICD-10-CM

## 2018-08-26 DIAGNOSIS — F5102 Adjustment insomnia: Secondary | ICD-10-CM

## 2018-08-26 MED ORDER — LABETALOL HCL 200 MG PO TABS: 400.0000 mg | ORAL_TABLET | Freq: Two times a day (BID) | ORAL | 5 refills | Status: DC

## 2018-08-26 MED ORDER — DULOXETINE HCL 30 MG PO CPEP: 30.0000 mg | ORAL_CAPSULE | Freq: Every day | ORAL | 1 refills | Status: DC

## 2018-08-26 NOTE — Progress Notes (Addendum)
Subjective:     Patient ID: Erin Barrett , female    DOB: 13-Jun-1965 , 53 y.o.   MRN: [id number]   Chief Complaint  Patient presents with  . Shoulder Pain    right  . Anxiety    HPI  She is here today for eval of right shoulder pain and anxiety.  Please see below regarding shoulder issues.   Additionally, she has been having some anxiety.  Her sx have worsened over the past several weeks. She reports work is getting more stressful. She was written up today by her supervisor just prior to her visit today. She also c/o difficulty sleeping at night. She denies change in appetite and sleep habits. Her anxiety worsens when she thinks about work. She feels she is losing control. She works in Therapist, art for [employer].  She has found that she has difficulty focusing at work. She reports she has always done well at her job. 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   Shoulder Pain  The pain is present in the right shoulder. This is a recurrent problem. The current episode started more than 1 month ago. There has been no history of extremity trauma. The problem occurs constantly. The quality of the pain is described as dull. The pain is at a severity of 5/10. The pain is moderate. Pertinent negatives include no fever. The symptoms are aggravated by activity. She has tried acetaminophen for the symptoms. The treatment provided moderate relief. Family history does not include rheumatoid arthritis. Her past medical history is significant for osteoarthritis. There is no history of diabetes.  Anxiety Presents for initial visit. The problem has been unchanged. Symptoms include decreased concentration, irritability, muscle tension and nervous/anxious behavior. Patient reports no chest pain. The severity of symptoms is severe and interfering with daily activities. The symptoms are aggravated by work stress. The  quality of sleep is poor. Nighttime awakenings: several.   Past treatments include nothing. The treatment provided moderate relief.     Past Medical History:  Diagnosis Date  . Cancer ([place])   . Hypertension   . Obesity      Family History  Problem Relation Age of Onset  . CVA Mother   . Heart disease Father      Current Outpatient Medications:  .  amLODipine (NORVASC) 10 MG tablet, TAKE 1 TABLET BY MOUTH EVERY DAY, Disp: 90 tablet, Rfl: 1 .  enalapril (VASOTEC) 20 MG tablet, Take 1 tablet (20 mg total) by mouth daily., Disp: 90 tablet, Rfl: 1 .  furosemide (LASIX) 40 MG tablet, TAKE 1 TABLET BY MOUTH EVERY DAY, Disp: 90 tablet, Rfl: 1 .  hydrALAZINE (APRESOLINE) 50 MG tablet, TAKE 1 TABLET 3 TIMES A DAY WITH FOOD, Disp: 270 tablet, Rfl: 2 .  labetalol (NORMODYNE) 200 MG tablet, Take 2 tablets (400 mg total) by mouth 2 (two) times daily., Disp: 360 tablet, Rfl: 5 .  Magnesium 500 MG CAPS, Take by mouth daily. , Disp: , Rfl:  .  phentermine 15 MG capsule, Take 1 capsule (15 mg total) by mouth every morning., Disp: 30 capsule, Rfl: 1 .  DULoxetine (CYMBALTA) 30 MG capsule, Take 1 capsule (30 mg total) by mouth daily., Disp: 30 capsule, Rfl: 1 .  potassium chloride SA (KLOR-CON M20) 20 MEQ tablet, TAKE 1 TABLET BY MOUTH EVERY DAY (Patient not taking: Reported on 08/26/2018), Disp: 90  tablet, Rfl: 1 .  Vitamin D, Ergocalciferol, (DRISDOL) 1.25 MG (50000 UT) CAPS capsule, TAKE 1 CAPSULE (50,000 UNITS TOTAL) BY MOUTH 2 (TWO) TIMES A WEEK. (Patient not taking: Reported on 08/26/2018), Disp: 24 capsule, Rfl: 1   No Known Allergies   Review of Systems  Constitutional: Positive for irritability. Negative for fever.  Respiratory: Negative.   Cardiovascular: Negative.  Negative for chest pain.  Gastrointestinal: Negative.   Neurological: Negative.   Psychiatric/Behavioral: Positive for decreased concentration, dysphoric mood and sleep disturbance. The patient is nervous/anxious.       Today's Vitals   08/26/18 1131  BP: (!) 144/96  Pulse: (!) 102  Temp: 98.8 F (37.1 C)  TempSrc: Oral  Weight: 299 lb 12.8 oz (136 kg)  Height: 5' 6.6" (1.692 m)  PainSc: 2   PainLoc: Shoulder   Body mass index is 47.52 kg/m.   Objective:  Physical Exam Vitals signs and nursing note reviewed.  Constitutional:      Appearance: Normal appearance.  HENT:     Head: Normocephalic and atraumatic.  Cardiovascular:     Rate and Rhythm: Normal rate and regular rhythm.     Heart sounds: Normal heart sounds.  Pulmonary:     Effort: Pulmonary effort is normal.     Breath sounds: Normal breath sounds.  Musculoskeletal:     Right shoulder: She exhibits tenderness.  Skin:    General: Skin is warm.  Neurological:     General: No focal deficit present.     Mental Status: She is alert.  Psychiatric:        Mood and Affect: Mood is anxious.        Behavior: Behavior normal.     Comments: She is tearful.          Assessment And Plan:     1. Acute pain of right shoulder  She plans to contact Dr. Rip Harbour herself for further evaluation. I do not think her sx are due to adhesive capsulitis, she had full ROM today.   2. Adjustment disorder with mixed anxiety and depressed mood  She agrees to pursue counseling. She wishes to first seek counseling through her EAP program. I will take her out of work for the next four weeks. I will start her on duloxetine 30 mg po qpm. Possible side effects were discussed with the patient. She is advised to take nightly. She will rto in 3 weeks for re-evaluation.   3. Hypertensive heart disease with chronic diastolic congestive heart failure ([facility])  Chronic, uncontrolled today. Likely related to work stress. She will continue with current meds. She is encouraged to limit her salt intake.   4. Class 3 severe obesity due to excess calories with serious comorbidity and body mass index (BMI) of 45.0 to 49.9 in adult [facility])  Pt advised that she did not  gain any weight since her last visit. She is encouraged to aim for 150 minutes of exercise per week.   5. Adjustment insomnia  She will start magnesium 400mg  nightly. Pt advised her sx may improve with use of duloxetine. She will let me know if her sx persist.   [name], MD    THE PATIENT IS ENCOURAGED TO PRACTICE SOCIAL DISTANCING DUE TO THE COVID-19 PANDEMIC.

## 2018-09-03 ENCOUNTER — Encounter: Payer: Self-pay | Admitting: Internal Medicine

## 2018-09-05 ENCOUNTER — Ambulatory Visit (INDEPENDENT_AMBULATORY_CARE_PROVIDER_SITE_OTHER): Payer: 59 | Admitting: Family Medicine

## 2018-09-05 ENCOUNTER — Encounter (INDEPENDENT_AMBULATORY_CARE_PROVIDER_SITE_OTHER): Payer: Self-pay | Admitting: Family Medicine

## 2018-09-05 ENCOUNTER — Other Ambulatory Visit: Payer: Self-pay

## 2018-09-05 VITALS — BP 116/83 | HR 71 | Temp 98.0°F | Ht 67.0 in | Wt 294.0 lb

## 2018-09-05 DIAGNOSIS — F3289 Other specified depressive episodes: Secondary | ICD-10-CM | POA: Diagnosis not present

## 2018-09-05 DIAGNOSIS — I10 Essential (primary) hypertension: Secondary | ICD-10-CM | POA: Diagnosis not present

## 2018-09-05 DIAGNOSIS — Z0289 Encounter for other administrative examinations: Secondary | ICD-10-CM

## 2018-09-05 DIAGNOSIS — R5383 Other fatigue: Secondary | ICD-10-CM

## 2018-09-05 DIAGNOSIS — Z6841 Body Mass Index (BMI) 40.0 and over, adult: Secondary | ICD-10-CM

## 2018-09-05 DIAGNOSIS — Z9189 Other specified personal risk factors, not elsewhere classified: Secondary | ICD-10-CM | POA: Diagnosis not present

## 2018-09-05 DIAGNOSIS — G4733 Obstructive sleep apnea (adult) (pediatric): Secondary | ICD-10-CM

## 2018-09-05 DIAGNOSIS — R0602 Shortness of breath: Secondary | ICD-10-CM

## 2018-09-06 LAB — INSULIN, RANDOM: INSULIN: 9.5 u[IU]/mL (ref 2.6–24.9)

## 2018-09-06 LAB — T4, FREE: Free T4: 1.35 ng/dL (ref 0.82–1.77)

## 2018-09-06 LAB — CBC WITH DIFFERENTIAL/PLATELET
Basophils Absolute: 0 10*3/uL (ref 0.0–0.2)
Basos: 0 %
EOS (ABSOLUTE): 0.1 10*3/uL (ref 0.0–0.4)
Eos: 2 %
Hematocrit: 40.8 % (ref 34.0–46.6)
Hemoglobin: 14 g/dL (ref 11.1–15.9)
Immature Grans (Abs): 0 10*3/uL (ref 0.0–0.1)
Immature Granulocytes: 0 %
Lymphocytes Absolute: 2.5 10*3/uL (ref 0.7–3.1)
Lymphs: 39 %
MCH: 31.7 pg (ref 26.6–33.0)
MCHC: 34.3 g/dL (ref 31.5–35.7)
MCV: 92 fL (ref 79–97)
Monocytes Absolute: 0.4 10*3/uL (ref 0.1–0.9)
Monocytes: 7 %
Neutrophils Absolute: 3.3 10*3/uL (ref 1.4–7.0)
Neutrophils: 52 %
Platelets: 274 10*3/uL (ref 150–450)
RBC: 4.42 x10E6/uL (ref 3.77–5.28)
RDW: 13.1 % (ref 11.7–15.4)
WBC: 6.3 10*3/uL (ref 3.4–10.8)

## 2018-09-06 LAB — COMPREHENSIVE METABOLIC PANEL
ALT: 22 IU/L (ref 0–32)
AST: 33 IU/L (ref 0–40)
Albumin/Globulin Ratio: 2 (ref 1.2–2.2)
Albumin: 4.6 g/dL (ref 3.8–4.9)
Alkaline Phosphatase: 50 IU/L (ref 39–117)
BUN/Creatinine Ratio: 16 (ref 9–23)
BUN: 17 mg/dL (ref 6–24)
Bilirubin Total: 0.5 mg/dL (ref 0.0–1.2)
CO2: 24 mmol/L (ref 20–29)
Calcium: 9.7 mg/dL (ref 8.7–10.2)
Chloride: 101 mmol/L (ref 96–106)
Creatinine, Ser: 1.07 mg/dL — ABNORMAL HIGH (ref 0.57–1.00)
GFR calc Af Amer: 69 mL/min/{1.73_m2} (ref >59)
GFR calc non Af Amer: 60 mL/min/{1.73_m2} (ref >59)
Globulin, Total: 2.3 g/dL (ref 1.5–4.5)
Glucose: 92 mg/dL (ref 65–99)
Potassium: 4.1 mmol/L (ref 3.5–5.2)
Sodium: 142 mmol/L (ref 134–144)
Total Protein: 6.9 g/dL (ref 6.0–8.5)

## 2018-09-06 LAB — LIPID PANEL WITH LDL/HDL RATIO
Cholesterol, Total: 175 mg/dL (ref 100–199)
HDL: 52 mg/dL (ref >39)
LDL Calculated: 102 mg/dL — ABNORMAL HIGH (ref 0–99)
LDl/HDL Ratio: 2 ratio (ref 0.0–3.2)
Triglycerides: 104 mg/dL (ref 0–149)
VLDL Cholesterol Cal: 21 mg/dL (ref 5–40)

## 2018-09-06 LAB — HEMOGLOBIN A1C
Est. average glucose Bld gHb Est-mCnc: 108 mg/dL
Hgb A1c MFr Bld: 5.4 % (ref 4.8–5.6)

## 2018-09-06 LAB — FOLATE: Folate: >20 ng/mL (ref >3.0)

## 2018-09-06 LAB — T3: T3, Total: 104 ng/dL (ref 71–180)

## 2018-09-06 LAB — VITAMIN D 25 HYDROXY (VIT D DEFICIENCY, FRACTURES): Vit D, 25-Hydroxy: 72.2 ng/mL (ref 30.0–100.0)

## 2018-09-06 LAB — TSH: TSH: 0.605 u[IU]/mL (ref 0.450–4.500)

## 2018-09-06 LAB — VITAMIN B12: Vitamin B-12: 848 pg/mL (ref 232–1245)

## 2018-09-08 NOTE — Progress Notes (Signed)
Office: [phone]  /  Fax: [phone]   Dear Dr. Baird Cancer,   Thank you for referring Erin Barrett to our clinic. The following note includes my evaluation and treatment recommendations.  HPI:   Chief Complaint: OBESITY    Erin Barrett has been referred by Erin Chard, MD for consultation regarding her obesity and obesity related comorbidities.    Erin Barrett (MR# [id number]) is a 53 y.o. female who presents on 09/05/2018 for obesity evaluation and treatment. Current BMI is Body mass index is 46.05 kg/m. Erin Barrett has been struggling with her weight for many years and has been unsuccessful in either losing weight, maintaining weight loss, or reaching her healthy weight goal.      Erin Barrett is lactose intolerant, but  Can tolerate cheese. She has been on phentermine, Belviq, Contrave, and Saxenda in the past. For breakfast, she is doing 2 pieces of wheat toast, 2 scrambled eggs, cheese, and 2 strips of bacon (felt satisfied). She has coffee with 2 small creamers, and 3 tbsp of sugar. She ate around 4 pm at [facility] sandwich, had a  beef sandwich on bagnat (felt full). She is occasionally doing meal replacement shakes.     Erin Barrett attended our information session and states she is currently in the action stage of change and ready to dedicate time achieving and maintaining a healthier weight. Erin Barrett is interested in becoming our patient and working on intensive lifestyle modifications including (but not limited to) diet, exercise and weight loss.    Erin Barrett states her family eats meals together she thinks her family will eat healthier with  her her desired weight loss is 74 lbs she has been heavy most of  her life she started gaining weight in 1989 after her first pregnancy her heaviest weight ever was 400 lbs she has significant food cravings issues  she snacks frequently in the evenings she is frequently drinking liquids with calories she frequently makes poor food choices she struggles  with emotional eating    Erin Barrett feels her energy is lower than it should be. This has worsened with weight gain and has not worsened recently. Erin Barrett admits to daytime somnolence and  admits to waking up still tired. Patient has a history of obstructive sleep apnea. Patent has a history of symptoms of daytime Erin and morning headache. Patient generally gets 5 hours of sleep per night, and states they generally have nightime awakenings. Snoring is present. Apneic episodes are present. Epworth Sleepiness Score is 11.  Dyspnea on exertion Erin Barrett notes increasing shortness of breath with exercising and seems to be worsening over time with weight gain. She notes getting out of breath sooner with activity than she used to. This has not gotten worse recently. EKG-normal sinus rhythm at 71 BPM, poor R wave progression. Erin Barrett denies orthopnea.  Hypertension Erin Barrett is a 53 y.o. female with hypertension. Erin Barrett was diagnosed around 53 years of age. She is on enalapril, amlodipine, labetolol, and furosemide. She denies chest pain. She is working on weight loss to help control her blood pressure with the goal of decreasing her risk of heart attack and stroke.  At risk for cardiovascular disease Erin Barrett is at a higher than average risk for cardiovascular disease due to obesity and hypertension. She currently denies any chest pain.  Obstructive Sleep Apnea Erin Barrett has a CPAP with minimal compliant. She needs a Erin sleep study and equipment.  Depression with Emotional Eating Behaviors Erin Barrett notes mindless snacking while at work and familial stress while at home.  Erin Barrett is struggling with emotional eating and using food for comfort to the extent that it is negatively impacting her health. She often snacks when she is not hungry. Erin Barrett sometimes feels she is out of control and then feels guilty that she made poor food choices. She has been working on behavior modification techniques to help  reduce her emotional eating and has been somewhat successful. She shows no sign of suicidal or homicidal ideations.  Depression screen [facility] 2/9 09/05/2018 08/26/2018 05/08/2018 01/21/2018  Decreased Interest 2 3 0 0  Down, Depressed, Hopeless 1 0 0 0  PHQ - 2 Score 3 3 0 0  Altered sleeping 3 3 - -  Tired, decreased energy 1 1 - -  Change in appetite 3 3 - -  Feeling bad or failure about yourself  0 3 - -  Trouble concentrating 2 0 - -  Moving slowly or fidgety/restless 0 0 - -  Suicidal thoughts 0 0 - -  PHQ-9 Score 12 13 - -  Difficult doing work/chores Not difficult at all Very difficult - -   Depression Screen Erin Barrett's Food and Mood (modified PHQ-9) score was  Depression screen PHQ 2/9 09/05/2018  Decreased Interest 2  Down, Depressed, Hopeless 1  PHQ - 2 Score 3  Altered sleeping 3  Tired, decreased energy 1  Change in appetite 3  Feeling bad or failure about yourself  0  Trouble concentrating 2  Moving slowly or fidgety/restless 0  Suicidal thoughts 0  PHQ-9 Score 12  Difficult doing work/chores Not difficult at all    ASSESSMENT AND PLAN:  Other Erin - Plan: EKG 12-Lead, Comprehensive metabolic panel, CBC with Differential/Platelet, Hemoglobin A1c, Insulin, random, VITAMIN D 25 Hydroxy (Vit-D Deficiency, Fractures), Vitamin B12, Folate, T4, free, T3, TSH  SOB (shortness of breath) on exertion - Plan: Lipid Panel With LDL/HDL Ratio  Essential hypertension  OSA (obstructive sleep apnea) - Plan: Ambulatory referral to Neurology  Other depression - with emotional eating   At risk for heart disease  Class 3 severe obesity with serious comorbidity and body mass index (BMI) of 45.0 to 49.9 in adult, unspecified obesity type (Erin Barrett)  PLAN:  Erin Barrett was informed that her Erin may be related to obesity, depression or many other causes. Labs will be ordered, and in the meanwhile Erin Barrett has agreed to work on diet, exercise and weight loss to help with Erin.  Proper sleep hygiene was discussed including the need for 7-8 hours of quality sleep each night. A sleep study was ordered based on symptoms and Epworth score.  Dyspnea on exertion Erin Barrett's shortness of breath appears to be obesity related and exercise induced. She has agreed to work on weight loss and gradually increase exercise to treat her exercise induced shortness of breath. If [facility] follows our instructions and loses weight without improvement of her shortness of breath, we will plan to refer to pulmonology. We will monitor this condition regularly. Erin Barrett agrees to this plan.  Hypertension We discussed sodium restriction, working on healthy weight loss, and a regular exercise program as the means to achieve improved blood pressure control. Cyntia agreed with this plan and agreed to follow up as directed. We will continue to monitor her blood pressure as well as her progress with the above lifestyle modifications. Erin Barrett will continue her medications as prescribed and will watch for signs of hypotension as she continues her lifestyle modifications. EKG was done, and we will check CMP and FLP today. Danice agrees to follow  up with our clinic in 2 weeks.  Cardiovascular risk counseling Markisha was given extended (15 minutes) coronary artery disease prevention counseling today. She is 53 y.o. female and has risk factors for heart disease including obesity and hypertension. We discussed intensive lifestyle modifications today with an emphasis on specific weight loss instructions and strategies. Pt was also informed of the importance of increasing exercise and decreasing saturated fats to help prevent heart disease.  Obstructive Sleep Apnea We have sent a referral to [facility] for sleep study. Erin Barrett agrees to follow up with our clinic in 2 weeks.  Depression with Emotional Eating Behaviors We discussed behavior modification techniques today to help [facility] deal with her emotional eating  and depression. Will will refer to Dr. Mallie Mussel, our Bariatric Psychologist for evaluation. Erin Barrett agrees to follow up with our clinic in 2 weeks.  Depression Screen Wendelin had a moderately positive depression screening. Depression is commonly associated with obesity and often results in emotional eating behaviors. We will monitor this closely and work on CBT to help improve the non-hunger eating patterns. Referral to Psychology may be required if no improvement is seen as she continues in our clinic.  Obesity Janora is currently in the action stage of change and her goal is to continue with weight loss efforts. I recommend Natajha begin the structured treatment plan as follows:  She has agreed to follow the Category 3 plan Fredricka has been instructed to eventually work up to a goal of 150 minutes of combined cardio and strengthening exercise per week for weight loss and overall health benefits. We discussed the following Behavioral Modification Strategies today: increasing lean protein intake, increasing vegetables and work on meal planning and easy cooking plans, keeping healthy foods in the home, and planning for success   She was informed of the importance of frequent follow up visits to maximize her success with intensive lifestyle modifications for her multiple health conditions. She was informed we would discuss her lab results at her next visit unless there is a critical issue that needs to be addressed sooner. Dejanira agreed to keep her next visit at the agreed upon time to discuss these results.  ALLERGIES: No Known Allergies  MEDICATIONS: Current Outpatient Medications on File Prior to Visit  Medication Sig Dispense Refill   amLODipine (NORVASC) 10 MG tablet TAKE 1 TABLET BY MOUTH EVERY DAY 90 tablet 1   DULoxetine (CYMBALTA) 30 MG capsule Take 1 capsule (30 mg total) by mouth daily. 30 capsule 1   enalapril (VASOTEC) 20 MG tablet Take 1 tablet (20 mg total) by mouth daily. 90 tablet  1   furosemide (LASIX) 40 MG tablet TAKE 1 TABLET BY MOUTH EVERY DAY 90 tablet 1   hydrALAZINE (APRESOLINE) 50 MG tablet TAKE 1 TABLET 3 TIMES A DAY WITH FOOD 270 tablet 2   labetalol (NORMODYNE) 200 MG tablet Take 2 tablets (400 mg total) by mouth 2 (two) times daily. 360 tablet 5   Magnesium 500 MG CAPS Take by mouth daily.      Multiple Vitamins-Minerals (CENTRUM SILVER PO) Take by mouth.     Vitamin D, Ergocalciferol, (DRISDOL) 1.25 MG (50000 UT) CAPS capsule TAKE 1 CAPSULE (50,000 UNITS TOTAL) BY MOUTH 2 (TWO) TIMES A WEEK. 24 capsule 1   phentermine 15 MG capsule Take 1 capsule (15 mg total) by mouth every morning. (Patient not taking: Reported on 09/05/2018) 30 capsule 1   potassium chloride SA (KLOR-CON M20) 20 MEQ tablet TAKE 1 TABLET BY MOUTH EVERY  DAY (Patient not taking: Reported on 08/26/2018) 90 tablet 1   No current facility-administered medications on file prior to visit.     PAST MEDICAL HISTORY: Past Medical History:  Diagnosis Date   Anxiety    Bilateral swelling of feet and ankles    Bursitis of hip    Left   Cancer (HCC)    CHF (congestive heart failure) (HCC)    Constipation    Depression    Drug use    Heartburn    Hypertension    Joint pain    Lactose intolerance    Obesity    Osteoarthritis of both knees    Shortness of breath    Sleep apnea    Vitamin D deficiency     PAST SURGICAL HISTORY: Past Surgical History:  Procedure Laterality Date   CESAREAN SECTION     IR RADIOLOGIST EVAL & MGMT  01/10/2017   KNEE SURGERY Left 2012    SOCIAL HISTORY: Social History   Tobacco Use   Smoking status: Former Smoker    Quit date: 01/1998    Years since quitting: 20.6   Smokeless tobacco: Never Used  Substance Use Topics   Alcohol use: Yes   Drug use: Not on file    FAMILY HISTORY: Family History  Problem Relation Age of Onset   CVA Mother    Hypertension Mother    Heart disease Mother    Stroke Mother     Anxiety disorder Mother    Obesity Mother    Heart disease Father    Hypertension Father    Sleep apnea Father    Obesity Father     ROS: Review of Systems  Constitutional: Positive for chills, fever and malaise/Erin. Negative for weight loss.       + Trouble sleeping  HENT: Positive for nosebleeds.   Eyes:       + Wear glasses or contacts  Respiratory: Positive for shortness of breath (with exertion).   Cardiovascular: Negative for chest pain and orthopnea.       + Chest tightness + Calf/leg pain with walking + Leg cramping  Musculoskeletal: Positive for back pain.       + Neck stiffness + Muscle or joint pain + Muscle stiffness  Neurological: Positive for weakness.  Endo/Heme/Allergies:       + Heat/cold intolerance  Psychiatric/Behavioral: Positive for depression. Negative for suicidal ideas.       + Stress    PHYSICAL EXAM: Blood pressure 116/83, pulse 71, temperature 98 F (36.7 C), temperature source Oral, height 5\' 7"  (1.702 m), weight 294 lb (133.4 kg), last menstrual period 04/29/2011, SpO2 100 %. Body mass index is 46.05 kg/m. Physical Exam Vitals signs reviewed.  Constitutional:      Appearance: Normal appearance. She is obese.  HENT:     Head: Normocephalic and atraumatic.     Nose: Nose normal.  Eyes:     General: No scleral icterus.    Extraocular Movements: Extraocular movements intact.  Neck:     Musculoskeletal: Normal range of motion and neck supple.     Comments: No thyromegaly present Cardiovascular:     Rate and Rhythm: Normal rate and regular rhythm.     Pulses: Normal pulses.     Heart sounds: Normal heart sounds.  Pulmonary:     Effort: Pulmonary effort is normal. No respiratory distress.     Breath sounds: Normal breath sounds.  Abdominal:     Palpations: Abdomen is soft.  Tenderness: There is no abdominal tenderness.     Comments: + Obesity  Musculoskeletal: Normal range of motion.     Right lower leg: No edema.      Left lower leg: No edema.  Skin:    General: Skin is warm and dry.  Neurological:     Mental Status: She is alert and oriented to person, place, and time.     Coordination: Coordination normal.  Psychiatric:        Mood and Affect: Mood normal.        Behavior: Behavior normal.     RECENT LABS AND TESTS: BMET    Component Value Date/Time   NA 142 09/05/2018 1239   K 4.1 09/05/2018 1239   CL 101 09/05/2018 1239   CO2 24 09/05/2018 1239   GLUCOSE 92 09/05/2018 1239   GLUCOSE 92 05/05/2013 0900   BUN 17 09/05/2018 1239   CREATININE 1.07 (H) 09/05/2018 1239   CALCIUM 9.7 09/05/2018 1239   GFRNONAA 60 09/05/2018 1239   GFRAA 69 09/05/2018 1239   Lab Results  Component Value Date   HGBA1C 5.4 09/05/2018   Lab Results  Component Value Date   INSULIN 9.5 09/05/2018   CBC    Component Value Date/Time   WBC 6.3 09/05/2018 1239   WBC 4.9 04/30/2011 0301   RBC 4.42 09/05/2018 1239   RBC 3.95 04/30/2011 0301   HGB 14.0 09/05/2018 1239   HCT 40.8 09/05/2018 1239   PLT 274 09/05/2018 1239   MCV 92 09/05/2018 1239   MCH 31.7 09/05/2018 1239   MCH 31.4 04/30/2011 0301   MCHC 34.3 09/05/2018 1239   MCHC 33.7 04/30/2011 0301   RDW 13.1 09/05/2018 1239   LYMPHSABS 2.5 09/05/2018 1239   MONOABS 0.6 04/30/2011 0301   EOSABS 0.1 09/05/2018 1239   BASOSABS 0.0 09/05/2018 1239   Iron/TIBC/Ferritin/ %Sat No results found for: IRON, TIBC, FERRITIN, IRONPCTSAT Lipid Panel     Component Value Date/Time   CHOL 175 09/05/2018 1239   TRIG 104 09/05/2018 1239   HDL 52 09/05/2018 1239   CHOLHDL 3.2 01/21/2018 1009   CHOLHDL 3.8 12/04/2009 0816   VLDL 14 12/04/2009 0816   LDLCALC 102 (H) 09/05/2018 1239   Hepatic Function Panel     Component Value Date/Time   PROT 6.9 09/05/2018 1239   ALBUMIN 4.6 09/05/2018 1239   AST 33 09/05/2018 1239   ALT 22 09/05/2018 1239   ALKPHOS 50 09/05/2018 1239   BILITOT 0.5 09/05/2018 1239      Component Value Date/Time   TSH 0.605  09/05/2018 1239   TSH 0.676 05/08/2018 1202   TSH 0.990 12/04/2009 0815    ECG  shows NSR with a rate of 71 BPM INDIRECT CALORIMETER done today shows a VO2 of 277 and a REE of 1931.  Her calculated basal metabolic rate is [id number] thus her basal metabolic rate is worse than expected.       OBESITY BEHAVIORAL INTERVENTION VISIT  Today's visit was # 1   Starting weight: 294 lbs Starting date: 09/05/2018 Today's weight : 294 lbs Today's date: 09/05/2018 Total lbs lost to date: 0    ASK: We discussed the diagnosis of obesity with Whitman Hero today and Ayonna agreed to give [place] permission to discuss obesity behavioral modification therapy today.  ASSESS: Teressia has the diagnosis of obesity and her BMI today is 46.04 Nachole is in the action stage of change   ADVISE: Vermelle was educated on the multiple health risks  of obesity as well as the benefit of weight loss to improve her health. She was advised of the need for long term treatment and the importance of lifestyle modifications to improve her current health and to decrease her risk of future health problems.  AGREE: Multiple dietary modification options and treatment options were discussed and  Beda agreed to follow the recommendations documented in the above note.  ARRANGE: Danyeal was educated on the importance of frequent visits to treat obesity as outlined per CMS and USPSTF guidelines and agreed to schedule her next follow up appointment today.  I, Trixie Dredge, am acting as transcriptionist for Ilene Qua, MD   I have reviewed the above documentation for accuracy and completeness, and I agree with the above. - Ilene Qua, MD

## 2018-09-10 LAB — HM MAMMOGRAPHY

## 2018-09-17 ENCOUNTER — Other Ambulatory Visit: Payer: Self-pay | Admitting: Internal Medicine

## 2018-09-18 ENCOUNTER — Other Ambulatory Visit: Payer: Self-pay

## 2018-09-18 ENCOUNTER — Ambulatory Visit (INDEPENDENT_AMBULATORY_CARE_PROVIDER_SITE_OTHER): Payer: 59 | Admitting: Neurology

## 2018-09-18 ENCOUNTER — Encounter: Payer: Self-pay | Admitting: Neurology

## 2018-09-18 ENCOUNTER — Encounter: Payer: Self-pay | Admitting: Internal Medicine

## 2018-09-18 ENCOUNTER — Ambulatory Visit (INDEPENDENT_AMBULATORY_CARE_PROVIDER_SITE_OTHER): Payer: 59 | Admitting: Internal Medicine

## 2018-09-18 VITALS — BP 132/78 | HR 80 | Temp 98.7°F | Ht 66.8 in | Wt 297.8 lb

## 2018-09-18 VITALS — BP 137/93 | HR 80 | Ht 67.0 in | Wt 300.0 lb

## 2018-09-18 DIAGNOSIS — Z6841 Body Mass Index (BMI) 40.0 and over, adult: Secondary | ICD-10-CM

## 2018-09-18 DIAGNOSIS — Z23 Encounter for immunization: Secondary | ICD-10-CM

## 2018-09-18 DIAGNOSIS — F419 Anxiety disorder, unspecified: Secondary | ICD-10-CM | POA: Diagnosis not present

## 2018-09-18 DIAGNOSIS — I5032 Chronic diastolic (congestive) heart failure: Secondary | ICD-10-CM

## 2018-09-18 DIAGNOSIS — F5102 Adjustment insomnia: Secondary | ICD-10-CM | POA: Diagnosis not present

## 2018-09-18 DIAGNOSIS — G4719 Other hypersomnia: Secondary | ICD-10-CM | POA: Diagnosis not present

## 2018-09-18 DIAGNOSIS — G4733 Obstructive sleep apnea (adult) (pediatric): Secondary | ICD-10-CM

## 2018-09-18 MED ORDER — DULOXETINE HCL 60 MG PO CPEP: 60.0000 mg | ORAL_CAPSULE | Freq: Every day | ORAL | 2 refills | Status: DC

## 2018-09-18 NOTE — Progress Notes (Signed)
Subjective:    Patient ID: Erin Barrett is a 53 y.o. female.  HPI     Star Age, MD, PhD [facility] [address] [place], [place] [address]  Dear Dr. Adair Patter, I saw your patient, Erin Barrett, upon your kind request in my sleep clinic today for initial consultation of her sleep disorder, in particular, reevaluation of her prior diagnosis of obstructive sleep apnea.  The patient is unaccompanied today.  As you know, Erin Barrett is a 53 year old right-handed woman with an underlying medical history of vitamin D deficiency, lactose intolerance, hypertension, depression, anxiety, chronic diastolic CHF, osteoarthritis, L hip bursitis, and morbid obesity with a BMI of over 45, who was previously diagnosed with obstructive sleep apnea and placed on CPAP therapy.  I reviewed your office note from 09/05/2018.  Prior sleep study results are not available for my review today.  She has not been using her CPAP machine.  Her Epworth sleepiness score is 13 out of 24.  Her CPAP machine is older, approximately 52 years old and she needed new supplies, stopped using her machine sometime ago.  Her DME company was [employer].  A CPAP compliance download is not possible from this machine for [place].  She uses nasal pillows and preferred those over the full facemask.  She does not currently have a set bedtime, it is not unusual for her to go to bed around 1 or 2, she has to be up at 6 AM and has to be at work at 8:30 AM.  She lives about 5 minutes away from work, in the past when she was first diagnosed her before her diagnosis in fact she was very sleepy even at the wheel and had dozed off while driving which was very scary to her.  She does not take any long distance drives unless it is first thing in the morning.  When she was consistently using her CPAP she felt improved and her daytime somnolence.  She was already on treatment with CPAP when she was first diagnosed with CHF in 2010  or so.  She denies any symptoms of restless legs but does have discomfort and is a restless sleeper, has left hip bursitis with status post injection under orthopedics.  She lives with her daughter who is 28 years old and her youngest son who is 65.  She has a TV in her bedroom but rarely utilizes it.  She has nocturia occasionally, not nightly and does not have any recurrent morning headaches, both her sisters have sleep apnea, on CPAP machines.  She had lost weight but started gaining weight again.  She is motivated to get retested for sleep apnea and consider CPAP therapy again.  She has 1 dog in the household, the dog sleeps with her on the bed.  She sees Dr. Daneen Schick for her congestive heart failure and had a recent echocardiogram.  Her Past Medical History Is Significant For: Past Medical History:  Diagnosis Date  . Anxiety   . Bilateral swelling of feet and ankles   . Bursitis of hip    Left  . Cancer ([place])   . CHF (congestive heart failure) ([place])   . Constipation   . Depression   . Drug use   . Heartburn   . Hypertension   . Joint pain   . Lactose intolerance   . Obesity   . Osteoarthritis of both knees   . Shortness of breath   . Sleep apnea   .  Vitamin D deficiency     Her Past Surgical History Is Significant For: Past Surgical History:  Procedure Laterality Date  . CESAREAN SECTION    . IR RADIOLOGIST EVAL & MGMT  01/10/2017  . KNEE SURGERY Left 2012    Her Family History Is Significant For: Family History  Problem Relation Age of Onset  . CVA Mother   . Hypertension Mother   . Heart disease Mother   . Stroke Mother   . Anxiety disorder Mother   . Obesity Mother   . Heart disease Father   . Hypertension Father   . Sleep apnea Father   . Obesity Father     Her Social History Is Significant For: Social History   Socioeconomic History  . Marital status: Legally Separated    Spouse name: Not on file  . Number of children: Not on file  . Years of  education: Not on file  . Highest education level: Not on file  Occupational History  . Not on file  Social Needs  . Financial resource strain: Not on file  . Food insecurity    Worry: Not on file    Inability: Not on file  . Transportation needs    Medical: Not on file    Non-medical: Not on file  Tobacco Use  . Smoking status: Former Smoker    Quit date: 01/1998    Years since quitting: 20.7  . Smokeless tobacco: Never Used  Substance and Sexual Activity  . Alcohol use: Yes  . Drug use: Not on file  . Sexual activity: Not on file  Lifestyle  . Physical activity    Days per week: Not on file    Minutes per session: Not on file  . Stress: Not on file  Relationships  . Social Herbalist on phone: Not on file    Gets together: Not on file    Attends religious service: Not on file    Active member of club or organization: Not on file    Attends meetings of clubs or organizations: Not on file    Relationship status: Not on file  Other Topics Concern  . Not on file  Social History Narrative  . Not on file    Her Allergies Are:  No Known Allergies:   Her Current Medications Are:  Outpatient Encounter Medications as of 09/18/2018  Medication Sig  . amLODipine (NORVASC) 10 MG tablet TAKE 1 TABLET BY MOUTH EVERY DAY  . DULoxetine (CYMBALTA) 30 MG capsule TAKE 1 CAPSULE BY MOUTH EVERY DAY  . enalapril (VASOTEC) 20 MG tablet Take 1 tablet (20 mg total) by mouth daily.  . furosemide (LASIX) 40 MG tablet TAKE 1 TABLET BY MOUTH EVERY DAY  . hydrALAZINE (APRESOLINE) 50 MG tablet TAKE 1 TABLET 3 TIMES A DAY WITH FOOD  . labetalol (NORMODYNE) 200 MG tablet Take 2 tablets (400 mg total) by mouth 2 (two) times daily.  . Magnesium 500 MG CAPS Take by mouth daily.   . Multiple Vitamins-Minerals (CENTRUM SILVER PO) Take by mouth.  . phentermine 15 MG capsule Take 1 capsule (15 mg total) by mouth every morning.  . potassium chloride SA (KLOR-CON M20) 20 MEQ tablet TAKE 1  TABLET BY MOUTH EVERY DAY  . Vitamin D, Ergocalciferol, (DRISDOL) 1.25 MG (50000 UT) CAPS capsule TAKE 1 CAPSULE (50,000 UNITS TOTAL) BY MOUTH 2 (TWO) TIMES A WEEK.   No facility-administered encounter medications on file as of 09/18/2018.   :  Review of Systems:  Out of a complete 14 point review of systems, all are reviewed and negative with the exception of these symptoms as listed below:  Review of Systems  Neurological:       Pt presents today to discuss her cpap. Pt's current cpap is over 21 years old and is not able to be downloaded by this office. She uses [employer]. She has not used her cpap consistently because she needs new supplies. Pt reports that her last sleep study was about 10 years ago.  Epworth Sleepiness Scale 0= would never doze 1= slight chance of dozing 2= moderate chance of dozing 3= high chance of dozing  Sitting and reading: 1 Watching TV: 3 Sitting inactive in a public place (ex. Theater or meeting): 2 As a passenger in a car for an hour without a break: 1 Lying down to rest in the afternoon: 2 Sitting and talking to someone: 2 Sitting quietly after lunch (no alcohol): 1 In a car, while stopped in traffic: 1 Total: 13     Objective:  Neurological Exam  Physical Exam Physical Examination:   Vitals:   09/18/18 0859  BP: (!) 137/93  Pulse: 80    General Examination: The patient is a very pleasant 53 y.o. female in no acute distress. She appears well-developed and well-nourished and well groomed.   HEENT: Normocephalic, atraumatic, pupils are equal, round and reactive to light and accommodation. Funduscopic exam is normal with sharp disc margins noted. Extraocular tracking is good without limitation to gaze excursion or nystagmus noted. Normal smooth pursuit is noted. Hearing is grossly intact. Tympanic membranes are clear bilaterally. Face is symmetric with normal facial animation and normal facial sensation. Speech is clear with no dysarthria noted.  There is no hypophonia. There is no lip, neck/head, jaw or voice tremor. Neck is supple with full range of passive and active motion. There are no carotid bruits on auscultation. Oropharynx exam reveals: mild mouth dryness, adequate dental hygiene and moderate airway crowding, due to Redundant soft palate and wider tongue, Mallampati class III.  Tonsils are about 1+ bilaterally.  Uvula does not seem to be enlarged.  Neck circumference is 19-1/8 inches.  She has a mild overbite. She has missing teeth in the back and has a slightly skewed bite alignment.  Tongue protrudes centrally in palate elevates symmetrically.   Chest: Clear to auscultation without wheezing, rhonchi or crackles noted.  Heart: S1+S2+0, regular and normal without murmurs, rubs or gallops noted.   Abdomen: Soft, non-tender and non-distended with normal bowel sounds appreciated on auscultation.  Extremities: There is no pitting edema in the distal lower extremities bilaterally.  Skin: Warm and dry without trophic changes noted.  Musculoskeletal: exam reveals no obvious joint deformities, tenderness or joint swelling or erythema.   Neurologically:  Mental status: The patient is awake, alert and oriented in all 4 spheres. Her immediate and remote memory, attention, language skills and fund of knowledge are appropriate. There is no evidence of aphasia, agnosia, apraxia or anomia. Speech is clear with normal prosody and enunciation. Thought process is linear. Mood is normal and affect is normal.  Cranial nerves II - XII are as described above under HEENT exam. In addition: shoulder shrug is normal with equal shoulder height noted. Motor exam: Normal bulk, strength and tone is noted. There is no drift, tremor or rebound. Romberg is negative. Fine motor skills and coordination: grossly intact.  Cerebellar testing: No dysmetria or intention tremor on finger to nose testing. Heel  to shin is unremarkable bilaterally. There is no truncal or  gait ataxia.  Sensory exam: intact to light touch.  Gait, station and balance: She stands easily. No veering to one side is noted. No leaning to one side is noted. Posture is age-appropriate and stance is narrow based. Gait shows normal stride length and normal pace. No problems turning are noted. Tandem walk is unremarkable. Assessment and plan:  In summary, Aliyah Hartford is a very pleasant 53 y.o.-year old female with an underlying medical history of vitamin D deficiency, lactose intolerance, hypertension, depression, anxiety, chronic diastolic CHF, osteoarthritis, L hip bursitis, and morbid obesity with a BMI of over 40, who Presents for reevaluation of her prior diagnosis of obstructive sleep apnea.  She has an older CPAP machine but has not used it in over a year she estimates.  She has not had any supplies.  She had sleep study testing over 10 years ago.  She has had weight fluctuations.  She would be willing to get retested and consider CPAP therapy again.  She did have improvement of her EDS when she was on treatment consistently in the past.  She is advised to proceed with sleep study testing, a laboratory sleep study is indicated given her CHF diagnosis.  I had a long chat with the patient about my findings and the diagnosis of OSA, its prognosis and treatment options. We talked about medical treatments, surgical interventions and non-pharmacological approaches. I explained in particular the risks and ramifications of untreated moderate to severe OSA, especially with respect to developing cardiovascular disease down the Road, including congestive heart failure, difficult to treat hypertension, cardiac arrhythmias, or stroke. Even type 2 diabetes has, in part, been linked to untreated OSA. Symptoms of untreated OSA include daytime sleepiness, memory problems, mood irritability and mood disorder such as depression and anxiety, lack of energy, as well as recurrent headaches, especially morning headaches.  We talked about trying to maintain a healthy lifestyle in general, as well as the importance of weight control. I encouraged the patient to eat healthy, exercise daily and keep well hydrated, to keep a scheduled bedtime and wake time routine, to not skip any meals and eat healthy snacks in between meals. I advised the patient not to drive when feeling sleepy. I recommended the following at this time: sleep study.   I explained the sleep test procedure to the patient and also outlined possible surgical and non-surgical treatment options of OSA, including the UPPP and Inspire (which would involve a referral to an ENT surgeon).  She indicated that she would be willing to try CPAP if the need arises. I explained the importance of being compliant with PAP treatment, not only for insurance purposes but primarily to improve Her symptoms, and for the patient's long term health benefit, including to reduce Her cardiovascular risks. I answered all her questions today and the patient was in agreement. I plan to see her back after the sleep study is completed and encouraged her to call with any interim questions, concerns, problems or updates.   Thank you very much for allowing me to participate in the care of this nice patient. If I can be of any further assistance to you please do not hesitate to call me at [phone].  Sincerely,   Star Age, MD, PhD

## 2018-09-18 NOTE — Patient Instructions (Signed)

## 2018-09-18 NOTE — Patient Instructions (Signed)

## 2018-09-19 ENCOUNTER — Encounter: Payer: Self-pay | Admitting: Internal Medicine

## 2018-09-19 ENCOUNTER — Ambulatory Visit (INDEPENDENT_AMBULATORY_CARE_PROVIDER_SITE_OTHER): Payer: 59 | Admitting: Family Medicine

## 2018-09-26 ENCOUNTER — Encounter: Payer: Self-pay | Admitting: Internal Medicine

## 2018-09-30 ENCOUNTER — Ambulatory Visit (INDEPENDENT_AMBULATORY_CARE_PROVIDER_SITE_OTHER): Payer: 59 | Admitting: Family Medicine

## 2018-09-30 ENCOUNTER — Ambulatory Visit (INDEPENDENT_AMBULATORY_CARE_PROVIDER_SITE_OTHER): Payer: 59 | Admitting: Bariatrics

## 2018-09-30 ENCOUNTER — Encounter (INDEPENDENT_AMBULATORY_CARE_PROVIDER_SITE_OTHER): Payer: Self-pay | Admitting: Bariatrics

## 2018-09-30 ENCOUNTER — Other Ambulatory Visit: Payer: Self-pay

## 2018-09-30 VITALS — BP 112/80 | HR 82 | Temp 98.8°F | Ht 67.0 in | Wt 296.0 lb

## 2018-09-30 DIAGNOSIS — E559 Vitamin D deficiency, unspecified: Secondary | ICD-10-CM

## 2018-09-30 DIAGNOSIS — Z9189 Other specified personal risk factors, not elsewhere classified: Secondary | ICD-10-CM | POA: Diagnosis not present

## 2018-09-30 DIAGNOSIS — F3289 Other specified depressive episodes: Secondary | ICD-10-CM

## 2018-09-30 DIAGNOSIS — Z6841 Body Mass Index (BMI) 40.0 and over, adult: Secondary | ICD-10-CM

## 2018-09-30 DIAGNOSIS — G4733 Obstructive sleep apnea (adult) (pediatric): Secondary | ICD-10-CM

## 2018-09-30 DIAGNOSIS — I10 Essential (primary) hypertension: Secondary | ICD-10-CM
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
## 2018-10-02 ENCOUNTER — Encounter (INDEPENDENT_AMBULATORY_CARE_PROVIDER_SITE_OTHER): Payer: Self-pay | Admitting: Bariatrics

## 2018-10-02 NOTE — Progress Notes (Signed)
Office: [phone]  /  Fax: [phone]   HPI:   Chief Complaint: OBESITY Erin Barrett is here to discuss her progress with her obesity treatment plan. She is on the Category 3 plan and is following her eating plan approximately 20% of the time. She states she is doing aerobics 60 minutes 3 times per week. Erin Barrett is up 2 lbs. She is struggling with getting in enough protein. This is her first visit with me, as she saw Dr. Adair Patter for her first visit. She has struggled with eating all of the meat and states she usually skips dinner. Her weight is 296 lb (134.3 kg) today and has had a weight gain of 2 lbs since her last visit. She has lost 0 lbs since starting treatment with [place].  Hypertension Erin Barrett is a 53 y.o. female with hypertension and is taking Norvasc, hydralazine, and Normodyne. Erin Barrett denies chest pain or shortness of breath on exertion. She is working weight loss to help control her blood pressure with the goal of decreasing her risk of heart attack and stroke. Erin Barrett's blood pressure is controlled.  Obstructive Sleep Apnea (OSA) Erin Barrett is waiting to have a sleep study.  Depression with emotional eating behaviors Erin Barrett is struggling with emotional eating and using food for comfort to the extent that it is negatively impacting her health. She often snacks when she is not hungry. Erin Barrett sometimes feels she is out of control and then feels guilty that she made poor food choices. She has been working on behavior modification techniques to help reduce her emotional eating and has been somewhat successful. She shows no sign of suicidal or homicidal ideations.  Depression screen [facility] 2/9 09/18/2018 09/05/2018 08/26/2018 05/08/2018 01/21/2018  Decreased Interest 0 2 3 0 0  Down, Depressed, Hopeless 1 1 0 0 0  PHQ - 2 Score 1 3 3  0 0  Altered sleeping 1 3 3  - -  Tired, decreased energy 0 1 1 - -  Change in appetite 0 3 3 - -  Feeling bad or failure about yourself  0 0 3 - -   Trouble concentrating 0 2 0 - -  Moving slowly or fidgety/restless 0 0 0 - -  Suicidal thoughts 0 0 0 - -  PHQ-9 Score 2 12 13  - -  Difficult doing work/chores Not difficult at all Not difficult at all Very difficult - -   Vitamin D deficiency Erin Barrett has a diagnosis of Vitamin D deficiency. Vitamin D is currently normal at 72.2. She denies nausea, vomiting or muscle weakness.  At risk for osteopenia and osteoporosis Erin Barrett is at higher risk of osteopenia and osteoporosis due to Vitamin D deficiency.   ASSESSMENT AND PLAN:  OSA (obstructive sleep apnea)  Vitamin D deficiency  Essential hypertension  Other depression - with emotional eating  At risk for osteoporosis  Class 3 severe obesity with serious comorbidity and body mass index (BMI) of 45.0 to 49.9 in adult, unspecified obesity type ([place])  PLAN:  Hypertension We discussed sodium restriction, working on healthy weight loss, and a regular exercise program as the means to achieve improved blood pressure control. Erin Barrett agreed with this plan and agreed to follow up as directed. We will continue to monitor her blood pressure as well as her progress with the above lifestyle modifications. She will continue her medications as prescribed and will watch for signs of hypotension as she continues her lifestyle modifications.  Obstructive Sleep Apnea (OSA) Erin Barrett will begin CPAP after her sleep  study.  Depression with Emotional Eating Behaviors We discussed behavior modification techniques today to help [facility] deal with her emotional eating and depression. She is being referred to Dr. Mallie Barrett, our bariatric psychologist, for evaluation.  Vitamin D Deficiency Erin Barrett was informed that low Vitamin D levels contributes to fatigue and are associated with obesity, breast, and colon cancer. She was advised to stop high dose Vitamin D and will start taking OTC Vitamin D 1,000 IU. She will follow-up for routine testing of Vitamin D, at least  2-3 times per year. She was informed of the risk of over-replacement of Vitamin D and agrees to not increase her dose unless she discusses this with [place] first. Erin Barrett agrees to follow-up with our clinic in 2 weeks.  At risk for osteopenia and osteoporosis Erin Barrett was given extended  (15 minutes) osteoporosis prevention counseling today. Erin Barrett is at risk for osteopenia and osteoporosis due to her Vitamin D deficiency. She was encouraged to take her Vitamin D and follow her higher calcium diet and increase strengthening exercise to help strengthen her bones and decrease her risk of osteopenia and osteoporosis.  Obesity Erin Barrett is currently in the action stage of change. As such, her goal is to continue with weight loss efforts. She has agreed to follow the Category 3 plan. Erin Barrett will work on meal planning and intentional eating. She will not skip meals. Erin Barrett has been instructed to work up to a goal of 150 minutes of combined cardio and strengthening exercise per week for weight loss and overall health benefits. We discussed the following Behavioral Modification Strategies today: increasing lean protein intake, decreasing simple carbohydrates, increasing vegetables, increase H20 intake, decrease eating out, no skipping meals, work on meal planning and easy cooking plans, keeping healthy foods in the home, and planning for success.  Erin Barrett has agreed to follow-up with our clinic in 2 weeks. She was informed of the importance of frequent follow-up visits to maximize her success with intensive lifestyle modifications for her multiple health conditions.  ALLERGIES: No Known Allergies  MEDICATIONS: Current Outpatient Medications on File Prior to Visit  Medication Sig Dispense Refill  . amLODipine (NORVASC) 10 MG tablet TAKE 1 TABLET BY MOUTH EVERY DAY 90 tablet 1  . DULoxetine (CYMBALTA) 60 MG capsule Take 1 capsule (60 mg total) by mouth daily. 30 capsule 2  . enalapril (VASOTEC) 20 MG tablet Take  1 tablet (20 mg total) by mouth daily. 90 tablet 1  . furosemide (LASIX) 40 MG tablet TAKE 1 TABLET BY MOUTH EVERY DAY 90 tablet 1  . hydrALAZINE (APRESOLINE) 50 MG tablet TAKE 1 TABLET 3 TIMES A DAY WITH FOOD 270 tablet 2  . labetalol (NORMODYNE) 200 MG tablet Take 2 tablets (400 mg total) by mouth 2 (two) times daily. 360 tablet 5  . Magnesium 500 MG CAPS Take by mouth daily.     . Multiple Vitamins-Minerals (CENTRUM SILVER PO) Take by mouth.    . potassium chloride SA (KLOR-CON M20) 20 MEQ tablet TAKE 1 TABLET BY MOUTH EVERY DAY 90 tablet 1  . Vitamin D, Ergocalciferol, (DRISDOL) 1.25 MG (50000 UT) CAPS capsule TAKE 1 CAPSULE (50,000 UNITS TOTAL) BY MOUTH 2 (TWO) TIMES A WEEK. 24 capsule 1   No current facility-administered medications on file prior to visit.     PAST MEDICAL HISTORY: Past Medical History:  Diagnosis Date  . Anxiety   . Bilateral swelling of feet and ankles   . Bursitis of hip    Left  . Cancer ([place])   .  CHF (congestive heart failure) ([place])   . Constipation   . Depression   . Drug use   . Heartburn   . Hypertension   . Joint pain   . Lactose intolerance   . Obesity   . Osteoarthritis of both knees   . Shortness of breath   . Sleep apnea   . Vitamin D deficiency     PAST SURGICAL HISTORY: Past Surgical History:  Procedure Laterality Date  . CESAREAN SECTION    . IR RADIOLOGIST EVAL & MGMT  01/10/2017  . KNEE SURGERY Left 2012    SOCIAL HISTORY: Social History   Tobacco Use  . Smoking status: Former Smoker    Quit date: 01/1998    Years since quitting: 20.7  . Smokeless tobacco: Never Used  Substance Use Topics  . Alcohol use: Yes    Comment: OCCASIONALLY  . Drug use: Yes    Types: Marijuana    FAMILY HISTORY: Family History  Problem Relation Age of Onset  . CVA Mother   . Hypertension Mother   . Heart disease Mother   . Stroke Mother   . Anxiety disorder Mother   . Obesity Mother   . Heart disease Father   . Hypertension Father   .  Sleep apnea Father   . Obesity Father    ROS: Review of Systems  Respiratory: Negative for shortness of breath.        Positive for OSA.  Cardiovascular: Negative for chest pain.  Gastrointestinal: Negative for nausea and vomiting.  Musculoskeletal:       Negative for muscle weakness.  Psychiatric/Behavioral: Positive for depression (emotional eating). Negative for suicidal ideas.       Negative for homicidal ideas.   PHYSICAL EXAM: Blood pressure 112/80, pulse 82, temperature 98.8 F (37.1 C), temperature source Oral, height 5\' 7"  (1.702 m), weight 296 lb (134.3 kg), last menstrual period 04/29/2011, SpO2 100 %. Body mass index is 46.36 kg/m. Physical Exam Vitals signs reviewed.  Constitutional:      Appearance: Normal appearance. She is obese.  Cardiovascular:     Rate and Rhythm: Normal rate.     Pulses: Normal pulses.  Pulmonary:     Effort: Pulmonary effort is normal.     Breath sounds: Normal breath sounds.  Musculoskeletal: Normal range of motion.  Skin:    General: Skin is warm and dry.  Neurological:     Mental Status: She is alert and oriented to person, place, and time.  Psychiatric:        Behavior: Behavior normal.   RECENT LABS AND TESTS: BMET    Component Value Date/Time   NA 142 09/05/2018 1239   K 4.1 09/05/2018 1239   CL 101 09/05/2018 1239   CO2 24 09/05/2018 1239   GLUCOSE 92 09/05/2018 1239   GLUCOSE 92 05/05/2013 0900   BUN 17 09/05/2018 1239   CREATININE 1.07 (H) 09/05/2018 1239   CALCIUM 9.7 09/05/2018 1239   GFRNONAA 60 09/05/2018 1239   GFRAA 69 09/05/2018 1239   Lab Results  Component Value Date   HGBA1C 5.4 09/05/2018   HGBA1C 5.6 05/08/2018   HGBA1C 5.3 01/21/2018   HGBA1C  12/04/2009    5.5 (NOTE)  According to the ADA Clinical Practice Recommendations for 2011, when HbA1c is used as a screening test:   >=6.5%   Diagnostic of Diabetes Mellitus           (if  abnormal result  is confirmed)  5.7-6.4%   Increased risk of developing Diabetes Mellitus  References:Diagnosis and Classification of Diabetes Mellitus,Diabetes [id number] 1):S62-S69 and Standards of Medical Care in         Diabetes - 2011,Diabetes [id number]  (Suppl 1):S11-S61.   Lab Results  Component Value Date   INSULIN 9.5 09/05/2018   CBC    Component Value Date/Time   WBC 6.3 09/05/2018 1239   WBC 4.9 04/30/2011 0301   RBC 4.42 09/05/2018 1239   RBC 3.95 04/30/2011 0301   HGB 14.0 09/05/2018 1239   HCT 40.8 09/05/2018 1239   PLT 274 09/05/2018 1239   MCV 92 09/05/2018 1239   MCH 31.7 09/05/2018 1239   MCH 31.4 04/30/2011 0301   MCHC 34.3 09/05/2018 1239   MCHC 33.7 04/30/2011 0301   RDW 13.1 09/05/2018 1239   LYMPHSABS 2.5 09/05/2018 1239   MONOABS 0.6 04/30/2011 0301   EOSABS 0.1 09/05/2018 1239   BASOSABS 0.0 09/05/2018 1239   Iron/TIBC/Ferritin/ %Sat No results found for: IRON, TIBC, FERRITIN, IRONPCTSAT Lipid Panel     Component Value Date/Time   CHOL 175 09/05/2018 1239   TRIG 104 09/05/2018 1239   HDL 52 09/05/2018 1239   CHOLHDL 3.2 01/21/2018 1009   CHOLHDL 3.8 12/04/2009 0816   VLDL 14 12/04/2009 0816   LDLCALC 102 (H) 09/05/2018 1239   Hepatic Function Panel     Component Value Date/Time   PROT 6.9 09/05/2018 1239   ALBUMIN 4.6 09/05/2018 1239   AST 33 09/05/2018 1239   ALT 22 09/05/2018 1239   ALKPHOS 50 09/05/2018 1239   BILITOT 0.5 09/05/2018 1239      Component Value Date/Time   TSH 0.605 09/05/2018 1239   TSH 0.676 05/08/2018 1202   TSH 0.990 12/04/2009 0815   Results for Ginni, Ixta Jamani (MRN [id number]) as of 10/02/2018 08:25  Ref. Range 09/05/2018 12:39  Vitamin D, 25-Hydroxy Latest Ref Range: 30.0 - 100.0 ng/mL 72.2   OBESITY BEHAVIORAL INTERVENTION VISIT  Today's visit was #2  Starting weight: 294 lbs Starting date: 09/05/2018 Today's weight: 296 lbs  Today's date: 09/30/2018 Total lbs lost to date: 0    09/30/2018   Height 5\' 7"  (1.702 m)  Weight 296 lb (134.3 kg)  BMI (Calculated) 46.35  BLOOD PRESSURE - SYSTOLIC [id number]  BLOOD PRESSURE - DIASTOLIC 80   Body Fat % [id number] %   ASK: We discussed the diagnosis of obesity with Erin Barrett today and Erin Barrett agreed to give [place] permission to discuss obesity behavioral modification therapy today.  ASSESS: Erin Barrett has the diagnosis of obesity and her BMI today is 46.4. Maximina is in the action stage of change.   ADVISE: Erin Barrett was educated on the multiple health risks of obesity as well as the benefit of weight loss to improve her health. She was advised of the need for long term treatment and the importance of lifestyle modifications to improve her current health and to decrease her risk of future health problems.  AGREE: Multiple dietary modification options and treatment options were discussed and  Erin Barrett agreed to follow the recommendations documented in the above note.  ARRANGE: Erin Barrett was educated on the importance of frequent visits to treat obesity as outlined per CMS and USPSTF guidelines and agreed to schedule  her next follow up appointment today.  Migdalia Dk, am acting as Location manager for [employer], DO   I have reviewed the above documentation for accuracy and completeness, and I agree with the above. -Jearld Lesch, DO

## 2018-10-06 ENCOUNTER — Encounter: Payer: Self-pay | Admitting: Internal Medicine

## 2018-10-06 NOTE — Progress Notes (Signed)
Subjective:     Patient ID: Erin Barrett , female    DOB: 1965-05-07 , 53 y.o.   MRN: [id number]   Chief Complaint  Patient presents with  . Anxiety  . Weight Check    HPI  She is here today for f/u anxiety. She has been evaluated by Erin Mackie, NP at Dr. Marquis Buggy office. She reports that she is in agreement with her diagnosis and agrees that she needs more time away from work. She reports increasing anxiety when she thinks about returning to work. She reports she has worked there for greater than 20 years and has always gotten along with her supervisors. She has been losing sleep at night which does affect her ability to concentrate.     Past Medical History:  Diagnosis Date  . Anxiety   . Bilateral swelling of feet and ankles   . Bursitis of hip    Left  . Cancer ([place])   . CHF (congestive heart failure) ([place])   . Constipation   . Depression   . Drug use   . Heartburn   . Hypertension   . Joint pain   . Lactose intolerance   . Obesity   . Osteoarthritis of both knees   . Shortness of breath   . Sleep apnea   . Vitamin D deficiency      Family History  Problem Relation Age of Onset  . CVA Mother   . Hypertension Mother   . Heart disease Mother   . Stroke Mother   . Anxiety disorder Mother   . Obesity Mother   . Heart disease Father   . Hypertension Father   . Sleep apnea Father   . Obesity Father      Current Outpatient Medications:  .  amLODipine (NORVASC) 10 MG tablet, TAKE 1 TABLET BY MOUTH EVERY DAY, Disp: 90 tablet, Rfl: 1 .  DULoxetine (CYMBALTA) 60 MG capsule, Take 1 capsule (60 mg total) by mouth daily., Disp: 30 capsule, Rfl: 2 .  enalapril (VASOTEC) 20 MG tablet, Take 1 tablet (20 mg total) by mouth daily., Disp: 90 tablet, Rfl: 1 .  furosemide (LASIX) 40 MG tablet, TAKE 1 TABLET BY MOUTH EVERY DAY, Disp: 90 tablet, Rfl: 1 .  hydrALAZINE (APRESOLINE) 50 MG tablet, TAKE 1 TABLET 3 TIMES A DAY WITH FOOD, Disp: 270 tablet, Rfl: 2 .  labetalol  (NORMODYNE) 200 MG tablet, Take 2 tablets (400 mg total) by mouth 2 (two) times daily., Disp: 360 tablet, Rfl: 5 .  Magnesium 500 MG CAPS, Take by mouth daily. , Disp: , Rfl:  .  Multiple Vitamins-Minerals (CENTRUM SILVER PO), Take by mouth., Disp: , Rfl:  .  potassium chloride SA (KLOR-CON M20) 20 MEQ tablet, TAKE 1 TABLET BY MOUTH EVERY DAY, Disp: 90 tablet, Rfl: 1 .  Vitamin D, Ergocalciferol, (DRISDOL) 1.25 MG (50000 UT) CAPS capsule, TAKE 1 CAPSULE (50,000 UNITS TOTAL) BY MOUTH 2 (TWO) TIMES A WEEK., Disp: 24 capsule, Rfl: 1   No Known Allergies   Review of Systems  Constitutional: Negative.   Respiratory: Negative.   Cardiovascular: Negative.   Gastrointestinal: Negative.   Neurological: Negative.   Psychiatric/Behavioral: Positive for sleep disturbance.     Today's Vitals   09/18/18 1641  BP: 132/78  Pulse: 80  Temp: 98.7 F (37.1 C)  TempSrc: Oral  SpO2: 98%  Weight: 297 lb 12.8 oz (135.1 kg)  Height: 5' 6.8" (1.697 m)   Body mass index is 46.92 kg/m.   Objective:  Physical Exam Vitals signs and nursing note reviewed.  Constitutional:      Appearance: Normal appearance.  HENT:     Head: Normocephalic and atraumatic.  Cardiovascular:     Rate and Rhythm: Normal rate and regular rhythm.     Heart sounds: Normal heart sounds.  Pulmonary:     Effort: Pulmonary effort is normal.     Breath sounds: Normal breath sounds.  Skin:    General: Skin is warm.  Neurological:     General: No focal deficit present.     Mental Status: She is alert.  Psychiatric:        Mood and Affect: Mood is anxious.        Speech: Speech normal.        Behavior: Behavior normal. Behavior is cooperative.        Thought Content: Thought content normal.        Cognition and Memory: Cognition normal.         Assessment And Plan:     1. Anxiety  Persistent. I will increase her dose of Duloxetine to 60mg  daily. She will continue under the care of Dr. Marquis Buggy team for both therapy  and further medication management. Unfortunately, she reports her STD claim was denied.  I will contact them to determine what led them to this decision. She was given another work note to be out another 4 weeks until October 7th, returning to work on October 8th. She will consult with mental health providers if she needs further time off of work.   2. Adjustment insomnia  This is likely related to her anxiety. She is advised to take magnesium nightly. If persistent, will consider use of Dayvigo, 5mg  nightly as needed.   3. Need for influenza vaccination  - Flu Vaccine QUAD 6+ mos PF IM (Fluarix Quad PF)        [name], MD    THE PATIENT IS ENCOURAGED TO PRACTICE SOCIAL DISTANCING DUE TO THE COVID-19 PANDEMIC.

## 2018-10-09 ENCOUNTER — Ambulatory Visit (INDEPENDENT_AMBULATORY_CARE_PROVIDER_SITE_OTHER): Payer: 59 | Admitting: Psychology

## 2018-10-09 ENCOUNTER — Other Ambulatory Visit: Payer: Self-pay

## 2018-10-09 ENCOUNTER — Encounter: Payer: Self-pay | Admitting: Internal Medicine

## 2018-10-09 DIAGNOSIS — F3289 Other specified depressive episodes: Secondary | ICD-10-CM

## 2018-10-15 ENCOUNTER — Ambulatory Visit: Payer: Self-pay | Admitting: Internal Medicine

## 2018-10-16 ENCOUNTER — Other Ambulatory Visit: Payer: Self-pay

## 2018-10-16 ENCOUNTER — Ambulatory Visit (INDEPENDENT_AMBULATORY_CARE_PROVIDER_SITE_OTHER): Payer: 59 | Admitting: Internal Medicine

## 2018-10-16 ENCOUNTER — Encounter (INDEPENDENT_AMBULATORY_CARE_PROVIDER_SITE_OTHER): Payer: Self-pay | Admitting: Bariatrics

## 2018-10-16 ENCOUNTER — Ambulatory Visit (INDEPENDENT_AMBULATORY_CARE_PROVIDER_SITE_OTHER): Payer: 59 | Admitting: Bariatrics

## 2018-10-16 ENCOUNTER — Encounter: Payer: Self-pay | Admitting: Internal Medicine

## 2018-10-16 VITALS — BP 143/93 | HR 78 | Temp 98.4°F | Ht 67.0 in | Wt 296.0 lb

## 2018-10-16 VITALS — BP 118/86 | HR 68 | Temp 98.2°F | Ht 66.6 in | Wt 300.2 lb

## 2018-10-16 DIAGNOSIS — F419 Anxiety disorder, unspecified: Secondary | ICD-10-CM | POA: Diagnosis not present

## 2018-10-16 DIAGNOSIS — I10 Essential (primary) hypertension: Secondary | ICD-10-CM

## 2018-10-16 DIAGNOSIS — E559 Vitamin D deficiency, unspecified: Secondary | ICD-10-CM

## 2018-10-16 DIAGNOSIS — Z6841 Body Mass Index (BMI) 40.0 and over, adult: Secondary | ICD-10-CM

## 2018-10-16 DIAGNOSIS — Z9189 Other specified personal risk factors, not elsewhere classified: Secondary | ICD-10-CM

## 2018-10-16 NOTE — Progress Notes (Signed)
Office: [phone]  /  Fax: [phone]   HPI:   Chief Complaint: OBESITY Erin Barrett is here to discuss her progress with her obesity treatment plan. She is on the Category 3 plan and is following her eating plan approximately 25% of the time. She states she is doing aerobics 45 minutes 3 times per week. Erin Barrett's weight has stayed the same. She went back to work this week. Her weight is 296 lb (134.3 kg) today and has not lost weight since her last visit. She has lost 0 lbs since starting treatment with [place].  Vitamin D deficiency Erin Barrett has a diagnosis of Vitamin D deficiency. Last Vitamin D 72.2 on 09/05/2018, resolved, and is currently taking OTC Vit D. She denies nausea, vomiting or muscle weakness.  At risk for osteopenia and osteoporosis Erin Barrett is at higher risk of osteopenia and osteoporosis due to Vitamin D deficiency.   Hypertension Erin Barrett is a 53 y.o. female with hypertension. Erin Barrett denies chest pain or shortness of breath on exertion. She is working weight loss to help control her blood pressure with the goal of decreasing her risk of heart attack and stroke. Erin Barrett's blood pressure is not well controlled today at 143/93 (took her medication late).  ASSESSMENT AND PLAN:  Vitamin D deficiency  Essential hypertension  At risk for osteoporosis  Class 3 severe obesity with serious comorbidity and body mass index (BMI) of 45.0 to 49.9 in adult, unspecified obesity type (Erin Barrett)  PLAN:  Vitamin D Deficiency Erin Barrett was informed that low Vitamin D levels contributes to fatigue and are associated with obesity, breast, and colon cancer. She agrees to continue taking OTC Vit D 1,000 IU daily and will follow-up for routine testing of Vitamin D, at least 2-3 times per year. She was informed of the risk of over-replacement of Vitamin D and agrees to not increase her dose unless she discusses this with [place] first. Erin Barrett agrees to follow-up with our clinic in 2 weeks.  At  risk for osteopenia and osteoporosis Erin Barrett was given extended  (15 minutes) osteoporosis prevention counseling today. Erin Barrett is at risk for osteopenia and osteoporosis due to her Vitamin D deficiency. She was encouraged to take her Vitamin D and follow her higher calcium diet and increase strengthening exercise to help strengthen her bones and decrease her risk of osteopenia and osteoporosis.  Hypertension We discussed sodium restriction, working on healthy weight loss, and a regular exercise program as the means to achieve improved blood pressure control. Erin Barrett agreed with this plan and agreed to follow up as directed. We will continue to monitor her blood pressure as well as her progress with the above lifestyle modifications. She will continue her medications as prescribed and will watch for signs of hypotension as she continues her lifestyle modifications.  Obesity Erin Barrett is currently in the action stage of change. As such, her goal is to continue with weight loss efforts. She has agreed to follow the Category 3 plan. Erin Barrett will work on meal planning, intentional eating, and will not skip meals (increase high protein snacks). Erin Barrett has been instructed to work up to a goal of 150 minutes of combined cardio and strengthening exercise per week for weight loss and overall health benefits. We discussed the following Behavioral Modification Strategies today: increasing lean protein intake, decreasing simple carbohydrates, increasing vegetables, increase H20 intake, decrease eating out, no skipping meals, work on meal planning and easy cooking plans, and keeping healthy foods in the home.  Erin Barrett has agreed to follow-up  with our clinic in 2 weeks. She was informed of the importance of frequent follow-up visits to maximize her success with intensive lifestyle modifications for her multiple health conditions.  ALLERGIES: No Known Allergies  MEDICATIONS: Current Outpatient Medications on File  Prior to Visit  Medication Sig Dispense Refill  . amLODipine (NORVASC) 10 MG tablet TAKE 1 TABLET BY MOUTH EVERY DAY 90 tablet 1  . DULoxetine (CYMBALTA) 60 MG capsule Take 1 capsule (60 mg total) by mouth daily. 30 capsule 2  . enalapril (VASOTEC) 20 MG tablet Take 1 tablet (20 mg total) by mouth daily. 90 tablet 1  . furosemide (LASIX) 40 MG tablet TAKE 1 TABLET BY MOUTH EVERY DAY 90 tablet 1  . hydrALAZINE (APRESOLINE) 50 MG tablet TAKE 1 TABLET 3 TIMES A DAY WITH FOOD 270 tablet 2  . labetalol (NORMODYNE) 200 MG tablet Take 2 tablets (400 mg total) by mouth 2 (two) times daily. 360 tablet 5  . Magnesium 500 MG CAPS Take by mouth daily.     . Multiple Vitamins-Minerals (CENTRUM SILVER PO) Take by mouth.    . potassium chloride SA (KLOR-CON M20) 20 MEQ tablet TAKE 1 TABLET BY MOUTH EVERY DAY 90 tablet 1   No current facility-administered medications on file prior to visit.     PAST MEDICAL HISTORY: Past Medical History:  Diagnosis Date  . Anxiety   . Bilateral swelling of feet and ankles   . Bursitis of hip    Left  . Cancer ([place])   . CHF (congestive heart failure) ([place])   . Constipation   . Depression   . Drug use   . Heartburn   . Hypertension   . Joint pain   . Lactose intolerance   . Obesity   . Osteoarthritis of both knees   . Shortness of breath   . Sleep apnea   . Vitamin D deficiency     PAST SURGICAL HISTORY: Past Surgical History:  Procedure Laterality Date  . CESAREAN SECTION    . IR RADIOLOGIST EVAL & MGMT  01/10/2017  . KNEE SURGERY Left 2012    SOCIAL HISTORY: Social History   Tobacco Use  . Smoking status: Former Smoker    Quit date: 01/1998    Years since quitting: 20.7  . Smokeless tobacco: Never Used  Substance Use Topics  . Alcohol use: Yes    Comment: OCCASIONALLY  . Drug use: Yes    Types: Marijuana    FAMILY HISTORY: Family History  Problem Relation Age of Onset  . CVA Mother   . Hypertension Mother   . Heart disease Mother   .  Stroke Mother   . Anxiety disorder Mother   . Obesity Mother   . Heart disease Father   . Hypertension Father   . Sleep apnea Father   . Obesity Father    ROS: Review of Systems  Respiratory: Negative for shortness of breath.   Cardiovascular: Negative for chest pain.  Gastrointestinal: Negative for nausea and vomiting.  Musculoskeletal:       Negative for muscle weakness.   PHYSICAL EXAM: Blood pressure (!) 143/93, pulse 78, temperature 98.4 F (36.9 C), temperature source Oral, height 5\' 7"  (1.702 m), weight 296 lb (134.3 kg), last menstrual period 04/29/2011, SpO2 99 %. Body mass index is 46.36 kg/m. Physical Exam Vitals signs reviewed.  Constitutional:      Appearance: Normal appearance. She is obese.  Cardiovascular:     Rate and Rhythm: Normal rate.  Pulses: Normal pulses.  Pulmonary:     Effort: Pulmonary effort is normal.     Breath sounds: Normal breath sounds.  Musculoskeletal: Normal range of motion.  Skin:    General: Skin is warm and dry.  Neurological:     Mental Status: She is alert and oriented to person, place, and time.  Psychiatric:        Behavior: Behavior normal.   RECENT LABS AND TESTS: BMET    Component Value Date/Time   NA 142 09/05/2018 1239   K 4.1 09/05/2018 1239   CL 101 09/05/2018 1239   CO2 24 09/05/2018 1239   GLUCOSE 92 09/05/2018 1239   GLUCOSE 92 05/05/2013 0900   BUN 17 09/05/2018 1239   CREATININE 1.07 (H) 09/05/2018 1239   CALCIUM 9.7 09/05/2018 1239   GFRNONAA 60 09/05/2018 1239   GFRAA 69 09/05/2018 1239   Lab Results  Component Value Date   HGBA1C 5.4 09/05/2018   HGBA1C 5.6 05/08/2018   HGBA1C 5.3 01/21/2018   HGBA1C  12/04/2009    5.5 (NOTE)                                                                       According to the ADA Clinical Practice Recommendations for 2011, when HbA1c is used as a screening test:   >=6.5%   Diagnostic of Diabetes Mellitus           (if abnormal result  is confirmed)  5.7-6.4%    Increased risk of developing Diabetes Mellitus  References:Diagnosis and Classification of Diabetes Mellitus,Diabetes [id number] 1):S62-S69 and Standards of Medical Care in         Diabetes - 2011,Diabetes Care,2011,34  (Suppl 1):S11-S61.   Lab Results  Component Value Date   INSULIN 9.5 09/05/2018   CBC    Component Value Date/Time   WBC 6.3 09/05/2018 1239   WBC 4.9 04/30/2011 0301   RBC 4.42 09/05/2018 1239   RBC 3.95 04/30/2011 0301   HGB 14.0 09/05/2018 1239   HCT 40.8 09/05/2018 1239   PLT 274 09/05/2018 1239   MCV 92 09/05/2018 1239   MCH 31.7 09/05/2018 1239   MCH 31.4 04/30/2011 0301   MCHC 34.3 09/05/2018 1239   MCHC 33.7 04/30/2011 0301   RDW 13.1 09/05/2018 1239   LYMPHSABS 2.5 09/05/2018 1239   MONOABS 0.6 04/30/2011 0301   EOSABS 0.1 09/05/2018 1239   BASOSABS 0.0 09/05/2018 1239   Iron/TIBC/Ferritin/ %Sat No results found for: IRON, TIBC, FERRITIN, IRONPCTSAT Lipid Panel     Component Value Date/Time   CHOL 175 09/05/2018 1239   TRIG 104 09/05/2018 1239   HDL 52 09/05/2018 1239   CHOLHDL 3.2 01/21/2018 1009   CHOLHDL 3.8 12/04/2009 0816   VLDL 14 12/04/2009 0816   LDLCALC 102 (H) 09/05/2018 1239   Hepatic Function Panel     Component Value Date/Time   PROT 6.9 09/05/2018 1239   ALBUMIN 4.6 09/05/2018 1239   AST 33 09/05/2018 1239   ALT 22 09/05/2018 1239   ALKPHOS 50 09/05/2018 1239   BILITOT 0.5 09/05/2018 1239      Component Value Date/Time   TSH 0.605 09/05/2018 1239   TSH 0.676 05/08/2018 1202   TSH 0.990 12/04/2009 0815  Results for MARCHE, GRANIER (MRN [id number]) as of 10/16/2018 11:14  Ref. Range 09/05/2018 12:39  Vitamin D, 25-Hydroxy Latest Ref Range: 30.0 - 100.0 ng/mL 72.2   OBESITY BEHAVIORAL INTERVENTION VISIT  Today's visit was #3  Starting weight: 294 lbs Starting date: 09/05/2018 Today's weight: 296 lbs Today's date: 10/16/2018 Total lbs lost to date: 0    10/16/2018  Height 5\' 7"  (1.702 m)  Weight 296 lb  (134.3 kg)  BMI (Calculated) 46.35  BLOOD PRESSURE - SYSTOLIC [id number]  BLOOD PRESSURE - DIASTOLIC 93   Body Fat % [id number] %   ASK: We discussed the diagnosis of obesity with Erin Barrett today and Erin Barrett agreed to give [place] permission to discuss obesity behavioral modification therapy today.  ASSESS: Erin Barrett has the diagnosis of obesity and her BMI today is 46.5. Paisleyann is in the action stage of change.   ADVISE: Erin Barrett was educated on the multiple health risks of obesity as well as the benefit of weight loss to improve her health. She was advised of the need for long term treatment and the importance of lifestyle modifications to improve her current health and to decrease her risk of future health problems.  AGREE: Multiple dietary modification options and treatment options were discussed and  Anvika agreed to follow the recommendations documented in the above note.  ARRANGE: Erin Barrett was educated on the importance of frequent visits to treat obesity as outlined per CMS and USPSTF guidelines and agreed to schedule her next follow up appointment today.  Migdalia Dk, am acting as Location manager for [employer], DO  I have reviewed the above documentation for accuracy and completeness, and I agree with the above. -Jearld Lesch, DO

## 2018-10-17 ENCOUNTER — Encounter (INDEPENDENT_AMBULATORY_CARE_PROVIDER_SITE_OTHER): Payer: Self-pay | Admitting: Bariatrics

## 2018-10-18 ENCOUNTER — Other Ambulatory Visit: Payer: Self-pay | Admitting: Internal Medicine

## 2018-10-22 NOTE — Progress Notes (Unsigned)
Office: [phone]  /  Fax: [phone]    Date: November 04, 2018   Appointment Start Time: *** Duration: *** minutes Provider: Glennie Isle, Psy.D. Type of Session: Individual Therapy  Location of Patient: *** Location of Provider: {Location of Service:22491} Type of Contact: Telepsychological Visit via [employer] WebEx   Session Content: Erin Barrett is a 53 y.o. female presenting via [employer] for a follow-up appointment to address the previously established treatment goal of decreasing emotional eating. Today's appointment was a telepsychological visit, as it is an option for appointments to reduce exposure to COVID-19. Erin Barrett expressed understanding regarding the rationale for telepsychological services, and provided verbal consent for today's appointment. Prior to proceeding with today's appointment, Erin Barrett's physical location at the time of this appointment was obtained. Erin Barrett reported she was at *** and provided the address. In the event of technical difficulties, Erin Barrett shared a phone number she could be reached at. Erin Barrett and this provider participated in today's telepsychological service. Also, Erin Barrett denied anyone else being present in the room Erin on the WebEx appointment ***.  This provider conducted a brief check-in and verbally administered the PHQ-9 and GAD-7. *** Erin Barrett was receptive to today's session as evidenced by openness to sharing, responsiveness to feedback, and ***.  Mental Status Examination:  Appearance: {Appearance:22431} Behavior: {Behavior:22445} Mood: {gbmood:21757} Affect: {Affect:22436} Speech: {Speech:22432} Eye Contact: {Eye Contact:22433} Psychomotor Activity: {Motor Activity:22434} Thought Process: {thought process:22448}  Content/Perceptual Disturbances: {disturbances:22451} Orientation: {Orientation:22437} Cognition/Sensorium: {gbcognition:22449} Insight: {Insight:22446} Judgment: {Insight:22446}  Structured Assessment Results: The Patient Health  Questionnaire-9 (PHQ-9) is a self-report measure that assesses symptoms and severity of depression over the course of the last two weeks. Erin Barrett obtained a score of *** suggesting {GBPHQ9SEVERITY:21752}. Erin Barrett finds the endorsed symptoms to be {gbphq9difficulty:21754}. Erin Barrett ***  Erin Barrett, Erin Barrett, Erin Barrett ***  Erin Barrett, Erin Barrett ***  Erin Barrett ***  Erin Barrett ***  Erin Barrett ***  Erin Barrett, such as reading the newspaper Erin watching television ***  Moving Erin speaking so slowly that other people could have noticed? Erin the opposite --- being so fidgety Erin restless that you have been moving around a lot more than usual ***  Thoughts that you would be better off dead Erin hurting yourself in some way ***  PHQ-9 Score ***    The Generalized Anxiety Disorder-7 (GAD-7) is a brief self-report measure that assesses symptoms of anxiety over the course of the last two weeks. Shuronda obtained a score of *** suggesting {gbgad7severity:21753}. Zaleah finds the endorsed symptoms to be {gbphq9difficulty:21754}. Erin nervous, anxious, on edge ***  Not being able to stop Erin control worrying ***  Worrying too Barrett about different Barrett ***  Erin relaxing ***  Being so restless that it's hard to sit still ***  Becoming easily annoyed Erin irritable ***  Erin afraid as if something awful might happen ***  GAD-7 Score ***   Interventions:  {Interventions:22172}  DSM-5 Diagnosis: 311 (F32.8) Other Specified Depressive Disorder, Emotional Eating Behaviors  Treatment Goal & Progress: During the initial appointment with this provider, the following treatment goal was established: decrease emotional eating. Evoleht has demonstrated progress in her goal as evidenced by  {gbtxprogress:22839}. Shateka also reported {gbtxprogress2:22951}.  Plan: Diary continues to appear able and willing to participate as evidenced by engagement in reciprocal conversation,  and asking questions for clarification as appropriate. The next appointment will be scheduled in {gbweeks:21758}, which will be via [employer]. The next session will focus on reviewing learned skills, and working towards the established treatment goal.***

## 2018-10-30 ENCOUNTER — Other Ambulatory Visit: Payer: Self-pay | Admitting: Nurse Practitioner

## 2018-10-30 ENCOUNTER — Encounter (INDEPENDENT_AMBULATORY_CARE_PROVIDER_SITE_OTHER): Payer: Self-pay

## 2018-10-30 ENCOUNTER — Ambulatory Visit (INDEPENDENT_AMBULATORY_CARE_PROVIDER_SITE_OTHER): Payer: 59 | Admitting: Bariatrics

## 2018-10-30 ENCOUNTER — Encounter: Payer: Self-pay | Admitting: Neurology

## 2018-10-30 DIAGNOSIS — E559 Vitamin D deficiency, unspecified: Secondary | ICD-10-CM

## 2018-11-03 NOTE — Progress Notes (Signed)
Subjective:     Patient ID: Erin Barrett , female    DOB: [birth year] , 53 y.o.   MRN: [id number]   Chief Complaint  Patient presents with  . Anxiety    patient stated she thinks the medicines are working for her so far but she has just returned back to work so she will see how they are working    HPI  She is here today for f/u anxiety. She feels well on the duloxetine. She is also followed by Dr. [facility] for therapy/counseling. She has recently returned to work. She has had a meeting with her supervisor, nothing new to report. She is hopeful that she does not have any other issues.   Anxiety Presents for follow-up visit. Patient reports no chest pain, compulsions or feeling of choking. The severity of symptoms is mild. The quality of sleep is fair. Nighttime awakenings: none.   Compliance with medications is 76-100%.     Past Medical History:  Diagnosis Date  . Anxiety   . Bilateral swelling of feet and ankles   . Bursitis of hip    Left  . Cancer ([place])   . CHF (congestive heart failure) ([place])   . Constipation   . Depression   . Drug use   . Heartburn   . Hypertension   . Joint pain   . Lactose intolerance   . Obesity   . Osteoarthritis of both knees   . Shortness of breath   . Sleep apnea   . Vitamin D deficiency      Family History  Problem Relation Age of Onset  . CVA Mother   . Hypertension Mother   . Heart disease Mother   . Stroke Mother   . Anxiety disorder Mother   . Obesity Mother   . Heart disease Father   . Hypertension Father   . Sleep apnea Father   . Obesity Father      Current Outpatient Medications:  .  amLODipine (NORVASC) 10 MG tablet, TAKE 1 TABLET BY MOUTH EVERY DAY, Disp: 90 tablet, Rfl: 1 .  busPIRone (BUSPAR) 10 MG tablet, Take 10 mg by mouth 2 (two) times daily., Disp: , Rfl:  .  enalapril (VASOTEC) 20 MG tablet, Take 1 tablet (20 mg total) by mouth daily., Disp: 90 tablet, Rfl: 1 .  furosemide (LASIX) 40 MG tablet,  TAKE 1 TABLET BY MOUTH EVERY DAY, Disp: 90 tablet, Rfl: 1 .  hydrALAZINE (APRESOLINE) 50 MG tablet, TAKE 1 TABLET 3 TIMES A DAY WITH FOOD, Disp: 270 tablet, Rfl: 2 .  labetalol (NORMODYNE) 200 MG tablet, Take 2 tablets (400 mg total) by mouth 2 (two) times daily., Disp: 360 tablet, Rfl: 5 .  Liraglutide -Weight Management (SAXENDA) 18 MG/3ML SOPN, Inject into the skin. Inject 1.2mg  intramuscularly daily, Disp: , Rfl:  .  Magnesium 500 MG CAPS, Take by mouth daily. , Disp: , Rfl:  .  Multiple Vitamins-Minerals (CENTRUM SILVER PO), Take by mouth., Disp: , Rfl:  .  DULoxetine (CYMBALTA) 60 MG capsule, TAKE 1 CAPSULE BY MOUTH EVERY DAY, Disp: 90 capsule, Rfl: 1 .  potassium chloride SA (KLOR-CON M20) 20 MEQ tablet, TAKE 1 TABLET BY MOUTH EVERY DAY (Patient not taking: Reported on 10/16/2018), Disp: 90 tablet, Rfl: 1   No Known Allergies   Review of Systems  Constitutional: Negative.   Respiratory: Negative.   Cardiovascular: Negative.  Negative for chest pain.  Gastrointestinal: Negative.   Neurological: Negative.   Psychiatric/Behavioral: Negative.  Today's Vitals   10/16/18 1144  BP: 118/86  Pulse: 68  Temp: 98.2 F (36.8 C)  TempSrc: Oral  Weight: (!) 300 lb 3.2 oz (136.2 kg)  Height: 5' 6.6" (1.692 m)  PainSc: 0-No pain   Body mass index is 47.58 kg/m.   Objective:  Physical Exam Vitals signs and nursing note reviewed.  Constitutional:      Appearance: Normal appearance.  HENT:     Head: Normocephalic and atraumatic.  Cardiovascular:     Rate and Rhythm: Normal rate and regular rhythm.     Heart sounds: Normal heart sounds.  Pulmonary:     Effort: Pulmonary effort is normal.     Breath sounds: Normal breath sounds.  Skin:    General: Skin is warm.  Neurological:     General: No focal deficit present.     Mental Status: She is alert.  Psychiatric:        Mood and Affect: Mood normal.        Behavior: Behavior normal.         Assessment And Plan:     1.  Anxiety  Improved with rx meds/therapy. She is encouraged to continue with therapy, even though she has returned to work. They will continue to reinforce coping mechanisms to help her deal with work stress.   2. Class 3 severe obesity due to excess calories with serious comorbidity and body mass index (BMI) of 45.0 to 49.9 in adult [facility])  She is now followed at [facility]. She is hopeful that they will be able to further assist with her weight loss efforts. She has no new concerns w/ regards to this diagnosis.   [name], MD    THE PATIENT IS ENCOURAGED TO PRACTICE SOCIAL DISTANCING DUE TO THE COVID-19 PANDEMIC.

## 2018-11-04 ENCOUNTER — Ambulatory Visit (INDEPENDENT_AMBULATORY_CARE_PROVIDER_SITE_OTHER): Payer: Self-pay | Admitting: Psychology

## 2018-11-05 ENCOUNTER — Telehealth: Payer: Self-pay

## 2018-11-05 NOTE — Telephone Encounter (Signed)
We have attempted to call the patient two times to schedule sleep study.  Patient has been unavailable at the phone numbers we have on file and has not returned our calls. If patient calls back we will schedule them for their sleep study.  

## 2018-11-06 ENCOUNTER — Ambulatory Visit: Payer: Self-pay | Admitting: Professional

## 2018-11-06 ENCOUNTER — Other Ambulatory Visit: Payer: Self-pay

## 2018-11-06 MED ORDER — ERGOCALCIFEROL 1.25 MG (50000 UT) PO CAPS: 50000.0000 [IU] | ORAL_CAPSULE | ORAL | 0 refills | Status: DC

## 2018-11-13 ENCOUNTER — Encounter (INDEPENDENT_AMBULATORY_CARE_PROVIDER_SITE_OTHER): Payer: Self-pay

## 2018-11-13 ENCOUNTER — Telehealth (INDEPENDENT_AMBULATORY_CARE_PROVIDER_SITE_OTHER): Payer: Self-pay | Admitting: Psychology

## 2018-11-13 NOTE — Telephone Encounter (Signed)
  Office: [phone]  /  Fax: [phone]  Date of Call: November 13, 2018  Time of Call: 1:37pm Duration of Call: 3 minutes Provider: Glennie Isle, PsyD  CONTENT: This provider called Erin Barrett to check-in and schedule a follow-up appointment. Erin Barrett stated due to work she had to cancel her previous appointment with this provider and she also missed her initial appointment with [place]. She requested this provider MyChart the number for [place] to re-schedule her missed appointment. No evidence of suicidal and homicidal ideation, plan, or intent  PLAN: Erin Barrett declined future appointments with this provider, as she plans to establish care with a new provider per the referral placed. She acknowledged understanding that she may request a follow-up appointment with this provider in the future as long as she is still established with the clinic. No further follow-up planned by this provider.

## 2018-11-18 ENCOUNTER — Telehealth: Payer: Self-pay

## 2018-11-18 NOTE — Telephone Encounter (Signed)
Left the pt a message that the pt's flma form has been completed, there is no fax number listed on the form for it to be faxed and for the pt to call back with a fax number if she wants it faxed, and that the original has been placed up front.

## 2018-11-20 ENCOUNTER — Other Ambulatory Visit: Payer: Self-pay | Admitting: Internal Medicine

## 2018-11-25 ENCOUNTER — Encounter: Payer: Self-pay | Admitting: Internal Medicine

## 2018-11-25 ENCOUNTER — Telehealth: Payer: Self-pay

## 2018-11-25 NOTE — Telephone Encounter (Signed)
I left the pt a message to call the office back.  I called the pt to let her know that she needed to ask her job for an extension on the work accomodation form because the forms was just dropped off on Thursday and it's 7 - 10 business days for forms to be completed, Dr. Baird Cancer needed to know what is the disability the pt needed an accomodation for, and in the future the pt should  make an appt to discuss forms that need to be completed.

## 2018-12-03 ENCOUNTER — Encounter: Payer: Self-pay | Admitting: Internal Medicine

## 2018-12-04 ENCOUNTER — Encounter: Payer: Self-pay | Admitting: Internal Medicine

## 2018-12-12 ENCOUNTER — Encounter: Payer: Self-pay | Admitting: Internal Medicine

## 2018-12-16 ENCOUNTER — Other Ambulatory Visit: Payer: Self-pay | Admitting: Internal Medicine

## 2018-12-18 ENCOUNTER — Encounter: Payer: Self-pay | Admitting: Internal Medicine

## 2018-12-29 ENCOUNTER — Ambulatory Visit (INDEPENDENT_AMBULATORY_CARE_PROVIDER_SITE_OTHER): Payer: 59 | Admitting: Neurology

## 2018-12-29 DIAGNOSIS — G4719 Other hypersomnia: Secondary | ICD-10-CM

## 2018-12-29 DIAGNOSIS — G4733 Obstructive sleep apnea (adult) (pediatric): Secondary | ICD-10-CM

## 2018-12-29 DIAGNOSIS — Z6841 Body Mass Index (BMI) 40.0 and over, adult: Secondary | ICD-10-CM

## 2018-12-29 DIAGNOSIS — I5032 Chronic diastolic (congestive) heart failure: Secondary | ICD-10-CM

## 2018-12-29 DIAGNOSIS — G4720 Circadian rhythm sleep disorder, unspecified type: Secondary | ICD-10-CM

## 2018-12-31 ENCOUNTER — Telehealth: Payer: Self-pay

## 2018-12-31 NOTE — Telephone Encounter (Signed)
PT CONTACTED TO F/U TO SEE IF EMPLOYER ACCEPTED THE FMLA PAPERWORK THAT WAS COMPLETED NO ANS LVM

## 2019-01-10 HISTORY — PX: CARPAL TUNNEL RELEASE: SHX101

## 2019-01-14 ENCOUNTER — Telehealth: Payer: Self-pay | Admitting: Neurology

## 2019-01-14 NOTE — Addendum Note (Signed)
Addended by: Star Age on: 01/14/2019 08:08 AM   Modules accepted: Orders

## 2019-01-14 NOTE — Progress Notes (Signed)
Patient referred by Dr. Adair Patter, seen by me on 09/18/18, diagnostic PSG on 12/29/18.   Please call and notify the patient that the recent sleep study showed moderate to severe OSA (obstructive sleep apnea). She has an old machine, but I recommend she return for a sleep study for proper CPAP titration and mask fitting and correct monitoring of the oxygen saturations. Please explain to patient. I have placed an order in the chart. Thanks.  Star Age, MD, PhD [facility])

## 2019-01-14 NOTE — Telephone Encounter (Signed)
-----   Message from Darleen Crocker, RN sent at 01/14/2019  8:20 AM EST -----  ----- Message ----- From: Star Age, MD Sent: 01/14/2019   8:08 AM EST To: Marval Regal, RN  Patient referred by Dr. Adair Patter, seen by me on 09/18/18, diagnostic PSG on 12/29/18.   Please call and notify the patient that the recent sleep study showed moderate to severe OSA (obstructive sleep apnea). She has an old machine, but I recommend she return for a sleep study for proper CPAP titration and mask fitting and correct monitoring of the oxygen saturations. Please explain to patient. I have placed an order in the chart. Thanks.  Star Age, MD, PhD [facility])

## 2019-01-14 NOTE — Telephone Encounter (Signed)
I called pt. I advised pt that Dr. Athar reviewed their sleep study results and found that pt has moderate to severe sleep apnea and recommends that pt be treated with a cpap. Dr. Athar recommends that pt return for a repeat sleep study in order to properly titrate the cpap and ensure a good mask fit. Pt is agreeable to returning for a titration study. I advised pt that our sleep lab will file with pt's insurance and call pt to schedule the sleep study when we hear back from the pt's insurance regarding coverage of this sleep study. Pt verbalized understanding of results. Pt had no questions at this time but was encouraged to call back if questions arise.   

## 2019-01-14 NOTE — Procedures (Signed)
PATIENT'S NAME:  Erin Barrett, Erin Barrett DOB:      1965-06-13      MR#:    [id number]     DATE OF RECORDING: 12/29/2018 REFERRING M.D.:  Dr. Adair Patter Study Performed:   Baseline Polysomnogram HISTORY: 54 year old with a history of vitamin D deficiency, lactose intolerance, hypertension, depression, anxiety, chronic diastolic CHF, osteoarthritis, L hip bursitis, and morbid obesity with a BMI of over 24, who was previously diagnosed with obstructive sleep apnea and placed on CPAP therapy. She has not been using her CPAP machine. The patient endorsed the Epworth Sleepiness Scale at 13/24 points. The patient's weight 300 pounds with a height of 67 (inches), resulting in a BMI of 47.1 kg/m2. The patient's neck circumference measured 19.2 inches.  CURRENT MEDICATIONS: Norvasc, Cymbalta, Vasotec, Lasix, Apresoline, Normodyne, Magnesium, Multivitamins, Klor-con, Drisdol.   PROCEDURE:  This is a multichannel digital polysomnogram utilizing the Somnostar 11.2 system.  Electrodes and sensors were applied and monitored per AASM Specifications.   EEG, EOG, Chin and Limb EMG, were sampled at 200 Hz.  ECG, Snore and Nasal Pressure, Thermal Airflow, Respiratory Effort, CPAP Flow and Pressure, Oximetry was sampled at 50 Hz. Digital video and audio were recorded.      BASELINE STUDY  Lights Out was at 21:33 and Lights On at 04:57.  Total recording time (TRT) was 445 minutes, with a total sleep time (TST) of 399.5 minutes.   The patient's sleep latency was 2 minutes. REM latency was 300.5 minutes, which is markedly delayed. The sleep efficiency was 89.8%.     SLEEP ARCHITECTURE: WASO (Wake after sleep onset) was 41.5 minutes with moderate sleep fragmentation noted. There were 11 minutes in Stage N1, 280.5 minutes Stage N2, 68 minutes Stage N3 and 40 minutes in Stage REM.  The percentage of Stage N1 was 2.8%, Stage N2 was 70.2%, which is markedly increased, Stage N3 was 17.% and Stage R (REM sleep) was 10.%, which is reduced. The  arousals were noted as: 78 were spontaneous, 0 were associated with PLMs, 52 were associated with respiratory events.  RESPIRATORY ANALYSIS:  There were a total of 137 respiratory events:  94 obstructive apneas, 11 central apneas and 0 mixed apneas with a total of 105 apneas and an apnea index (AI) of 15.8 /hour. There were 32 hypopneas with a hypopnea index of 4.8 /hour. The patient also had 0 respiratory event related arousals (RERAs).      The total APNEA/HYPOPNEA INDEX (AHI) was 20.6/hour and the total RESPIRATORY DISTURBANCE INDEX was  20.6 /hour.  34 events occurred in REM sleep and 54 events in NREM. The REM AHI was 51 /hour, versus a non-REM AHI of 17.2. The patient spent 155.5 minutes of total sleep time in the supine position and 244 minutes in non-supine.. The supine AHI was 26.3 versus a non-supine AHI of 17.0.  OXYGEN SATURATION & C02:  The Wake baseline 02 saturation was 96%, with the lowest being 68%. Time spent below 89% saturation equaled 19 minutes. PERIODIC LIMB MOVEMENTS: The patient had a total of 0 Periodic Limb Movements.  The Periodic Limb Movement (PLM) index was 0 and the PLM Arousal index was 0/hour.  Audio and video analysis did not show any abnormal or unusual movements, behaviors, phonations or vocalizations. The patient took no bathroom breaks. Moderate to loud snoring was noted. The EKG was in keeping with normal sinus rhythm (NSR).  Post-study, the patient indicated that sleep was the same as usual.   IMPRESSION:  1. Obstructive Sleep Apnea (  OSA) 2. Dysfunctions associated with sleep stages or arousal from sleep  RECOMMENDATIONS:  1. This study demonstrates moderate to severe obstructive sleep apnea, with a total AHI of 20.6/hour, REM AHI of 51/hour, supine AHI of 26.3/hour and O2 nadir of 68%. Treatment with positive airway pressure in the form of CPAP is recommended. This will require a full night titration study to optimize therapy. Other treatment options may  include avoidance of supine sleep position along with weight loss, upper airway or jaw surgery in selected patients or the use of an oral appliance in certain patients. ENT evaluation and/or consultation with a maxillofacial surgeon or dentist may be feasible in some instances.    2. Please note that untreated obstructive sleep apnea may carry additional perioperative morbidity. Patients with significant obstructive sleep apnea should receive perioperative PAP therapy and the surgeons and particularly the anesthesiologist should be informed of the diagnosis and the severity of the sleep disordered breathing. 3. This study shows sleep fragmentation and abnormal sleep stage percentages; these are nonspecific findings and per se do not signify an intrinsic sleep disorder or a cause for the patient's sleep-related symptoms. Causes include (but are not limited to) the first night effect of the sleep study, circadian rhythm disturbances, medication effect or an underlying mood disorder or medical problem.  4. The patient should be cautioned not to drive, work at heights, or operate dangerous or heavy equipment when tired or sleepy. Review and reiteration of good sleep hygiene measures should be pursued with any patient. 5. The patient will be seen in follow-up in the sleep clinic at [facility] for discussion of the test results, symptom and treatment compliance review, further management strategies, etc. The referring provider will be notified of the test results.  I certify that I have reviewed the entire raw data recording prior to the issuance of this report in accordance with the Standards of Accreditation of the American Academy of Sleep Medicine (AASM)  Star Age, MD, PhD Diplomat, American Board of Neurology and Sleep Medicine (Neurology and Sleep Medicine)

## 2019-01-16 ENCOUNTER — Encounter: Payer: Self-pay | Admitting: Internal Medicine

## 2019-01-20 ENCOUNTER — Encounter: Payer: Self-pay | Admitting: Internal Medicine

## 2019-01-20 ENCOUNTER — Other Ambulatory Visit: Payer: Self-pay

## 2019-01-20 ENCOUNTER — Ambulatory Visit (INDEPENDENT_AMBULATORY_CARE_PROVIDER_SITE_OTHER): Payer: BC Managed Care – PPO | Admitting: Internal Medicine

## 2019-01-20 VITALS — BP 136/100 | HR 90 | Temp 98.9°F | Ht 66.6 in | Wt 314.0 lb

## 2019-01-20 DIAGNOSIS — F419 Anxiety disorder, unspecified: Secondary | ICD-10-CM

## 2019-01-20 DIAGNOSIS — I5032 Chronic diastolic (congestive) heart failure: Secondary | ICD-10-CM

## 2019-01-20 DIAGNOSIS — R1012 Left upper quadrant pain: Secondary | ICD-10-CM | POA: Diagnosis not present

## 2019-01-20 DIAGNOSIS — R252 Cramp and spasm: Secondary | ICD-10-CM

## 2019-01-20 DIAGNOSIS — R040 Epistaxis: Secondary | ICD-10-CM | POA: Diagnosis not present

## 2019-01-20 DIAGNOSIS — I110 Hypertensive heart disease with heart failure: Secondary | ICD-10-CM

## 2019-01-20 DIAGNOSIS — Z6841 Body Mass Index (BMI) 40.0 and over, adult: Secondary | ICD-10-CM

## 2019-01-20 MED ORDER — CONTRAVE 8-90 MG PO TB12: ORAL_TABLET | ORAL | 0 refills | Status: DC

## 2019-01-20 MED ORDER — AMLODIPINE BESYLATE 10 MG PO TABS: 10.0000 mg | ORAL_TABLET | Freq: Every day | ORAL | 1 refills | Status: DC

## 2019-01-20 NOTE — Progress Notes (Signed)
This visit occurred during the SARS-CoV-2 public health emergency.  Safety protocols were in place, including screening questions prior to the visit, additional usage of staff PPE, and extensive cleaning of exam room while observing appropriate contact time as indicated for disinfecting solutions.  Subjective:     Patient ID: Erin Barrett , female    DOB: 02/03/1965 , 54 y.o.   MRN: [id number]   Chief Complaint  Patient presents with  . Epistaxis  . Abdominal Pain    HPI  She is here today for further evaluation of recurrent nosebleeds. She reports having nosebleeds from her right nostril. Most recent incident occurred while at work. She did not feel hot, or have any other precipitating symptoms. Denies recent URI.   Epistaxis  The bleeding has been from the right nare. This is a recurrent problem. The current episode started more than 1 month ago. The problem occurs every several days. The problem has been gradually worsening. The bleeding is associated with nothing. She has tried pressure for the symptoms. The treatment provided mild relief.  Abdominal Pain This is a recurrent problem. The current episode started 1 to 4 weeks ago. The onset quality is gradual. The problem occurs intermittently. The pain is located in the LUQ and generalized abdominal region. The pain is at a severity of 5/10. The pain is moderate. Associated symptoms include myalgias. Pertinent negatives include no anorexia, arthralgias, headaches, nausea or vomiting.     Past Medical History:  Diagnosis Date  . Anxiety   . Bilateral swelling of feet and ankles   . Bursitis of hip    Left  . Cancer ([place])   . CHF (congestive heart failure) ([place])   . Constipation   . Depression   . Drug use   . Heartburn   . Hypertension   . Joint pain   . Lactose intolerance   . Obesity   . Osteoarthritis of both knees   . Shortness of breath   . Sleep apnea   . Vitamin D deficiency      Family History  Problem  Relation Age of Onset  . CVA Mother   . Hypertension Mother   . Heart disease Mother   . Stroke Mother   . Anxiety disorder Mother   . Obesity Mother   . Heart disease Father   . Hypertension Father   . Sleep apnea Father   . Obesity Father      Current Outpatient Medications:  .  amLODipine (NORVASC) 10 MG tablet, Take 1 tablet (10 mg total) by mouth daily., Disp: 90 tablet, Rfl: 1 .  busPIRone (BUSPAR) 10 MG tablet, Take 10 mg by mouth 2 (two) times daily., Disp: , Rfl:  .  enalapril (VASOTEC) 20 MG tablet, TAKE 1 TABLET BY MOUTH EVERY DAY, Disp: 90 tablet, Rfl: 1 .  ergocalciferol (VITAMIN D2) 1.25 MG (50000 UT) capsule, Take 1 capsule (50,000 Units total) by mouth 2 (two) times a week. (Patient taking differently: Take 50,000 Units by mouth 2 (two) times a week. 2 times per month), Disp: 24 capsule, Rfl: 0 .  furosemide (LASIX) 40 MG tablet, TAKE 1 TABLET BY MOUTH EVERY DAY, Disp: 90 tablet, Rfl: 1 .  hydrALAZINE (APRESOLINE) 50 MG tablet, TAKE 1 TABLET 3 TIMES A DAY WITH FOOD, Disp: 270 tablet, Rfl: 2 .  labetalol (NORMODYNE) 200 MG tablet, Take 2 tablets (400 mg total) by mouth 2 (two) times daily., Disp: 360 tablet, Rfl: 5 .  Magnesium 500 MG CAPS, Take  by mouth daily. , Disp: , Rfl:  .  Multiple Vitamins-Minerals (CENTRUM SILVER PO), Take by mouth., Disp: , Rfl:  .  Liraglutide -Weight Management (SAXENDA) 18 MG/3ML SOPN, Inject into the skin. Inject 1.46m intramuscularly daily, Disp: , Rfl:  .  Naltrexone-buPROPion HCl ER (CONTRAVE) 8-90 MG TB12, Start 1 tablet every morning for 7 days, then 1 tablet twice daily for 7 days, then 2 tablets every morning and one every evening, Disp: 120 tablet, Rfl: 0 .  potassium chloride SA (KLOR-CON M20) 20 MEQ tablet, TAKE 1 TABLET BY MOUTH EVERY DAY (Patient not taking: Reported on 10/16/2018), Disp: 90 tablet, Rfl: 1   No Known Allergies   Review of Systems  Constitutional: Negative.   HENT: Positive for nosebleeds.   Respiratory:  Negative.   Cardiovascular: Negative.   Gastrointestinal: Positive for abdominal pain. Negative for anorexia, nausea and vomiting.  Musculoskeletal: Positive for myalgias. Negative for arthralgias.       C/o worsening muscle cramps. Recently ran out of magnesium. States she still had sx even though she was taking the magnesium. Unable to state what precipitates her sx. Usually resolves with vinegar or mustard intake.   Neurological: Negative.  Negative for headaches.  Psychiatric/Behavioral: Negative.      Today's Vitals   01/20/19 1453  BP: (!) 136/100  Pulse: 90  Temp: 98.9 F (37.2 C)  TempSrc: Oral  Weight: (!) 314 lb (142.4 kg)  Height: 5' 6.6" (1.692 m)  PainSc: 0-No pain   Body mass index is 49.77 kg/m.   Objective:  Physical Exam Vitals and nursing note reviewed.  Constitutional:      Appearance: Normal appearance.  HENT:     Head: Normocephalic and atraumatic.  Cardiovascular:     Rate and Rhythm: Normal rate and regular rhythm.     Heart sounds: Normal heart sounds.  Pulmonary:     Effort: Pulmonary effort is normal.     Breath sounds: Normal breath sounds.  Abdominal:     General: Bowel sounds are normal.     Palpations: Abdomen is soft.     Tenderness: There is no abdominal tenderness.     Comments: Obese, soft  Skin:    General: Skin is warm.  Neurological:     General: No focal deficit present.     Mental Status: She is alert.  Psychiatric:        Mood and Affect: Mood normal.        Behavior: Behavior normal.         Assessment And Plan:     1. Epistaxis  Recurrent. I will refer her to ENT. Pt advised that her sx may be worsening now b/c dry air (due to heat).   - Ambulatory referral to ENT  2. Hypertensive heart disease with chronic diastolic congestive heart failure (HCC)  Chronic, uncontrolled. Pt advised this could also be contributing to her nosebleeds. She admits that she does not always take second dose of some meds. Advised to take  second dose of labetalol and hydralazine with evening meal. She is in agreement with her new dosing schedule. She is also encouraged to continue with salt restriction.   3. LUQ pain  This is associated with generalized abdominal pain as well. I question if this is exacerbated by reflux. She was given samples of Nexium OTC to take once daily for the next 7-10 days. If she has some relief, I will send in a prescription.   4. Muscle cramps  Pt advised  this could also be cause of abdominal discomfort. I will check labs as listed below. She will start magnesium powder supplementation, I suggested that she try the Calm brand. Importance of adequate hydration was also discussed with the patient. I will make further recommendations once her labs are available for review.   - BMP8+EGFR - CK, total  5. Anxiety  Chronic, I will refer her to Dr. Toy Care as requested. She will continue with buspirone for now.   - Ambulatory referral to Psychiatry  6. Morbid obesity with BMI of 45.0-49.9, adult (HCC)  Chronic. She will resume Contrave. She was given dosing schedule to follow. She is reminded possible side effects include nausea. This has worked well for her in the past. She will f/u with [facility]. She is scheduled to f/u with me in May 2021.       [name], MD    THE PATIENT IS ENCOURAGED TO PRACTICE SOCIAL DISTANCING DUE TO THE COVID-19 PANDEMIC.

## 2019-01-20 NOTE — Patient Instructions (Addendum)
Contrave  One tab daily x 1 week, then One tab twice daily x 2 weeks, then One tab in AM, then 2 tabs po PM  Calm, magnesium powder  Bupropion; Naltrexone extended-release tablets What is this medicine? BUPROPION; NALTREXONE (byoo PROE pee on; nal TREX one) is a combination of two drugs that help you lose weight. This product is used with a reduced calorie diet and exercise. This product can also help you maintain weight loss. This medicine may be used for other purposes; ask your health care provider or pharmacist if you have questions. COMMON BRAND NAME(S): Contrave What should I tell my health care provider before I take this medicine? They need to know if you have any of these conditions:  an eating disorder, such as anorexia or bulimia  diabetes  depression  glaucoma  head injury  heart disease  high blood pressure  history of drug abuse or alcohol abuse problem  history of a tumor or infection of your brain or spine  history of heart attack or stroke  history of irregular heartbeat  if you often drink alcohol  kidney disease  liver disease  low levels of sodium in the blood  mental illness  seizures  suicidal thoughts, plans, or attempt; a previous suicide attempt by you or a family member  taken an MAOI like Carbex, Eldepryl, Marplan, Nardil, or Parnate in last 14 days  an unusual or allergic reaction to bupropion, naltrexone, other medicines, foods, dyes, or preservatives  breast-feeding  pregnant or trying to become pregnant How should I use this medicine? Take this medicine by mouth with a glass of water. Follow the directions on the prescription label. Do not cut, crush or chew this medicine. Swallow the tablets whole. You can take it with or without food. Do not take with high-fat meals as this may increase your risk of seizures. Take your medicine at regular intervals. Do not take it more often than directed. Do not stop taking except on your  doctor's advice. A special MedGuide will be given to you by the pharmacist with each prescription and refill. Be sure to read this information carefully each time. Talk to your pediatrician regarding the use of this medicine in children. Special care may be needed. Overdosage: If you think you have taken too much of this medicine contact a poison control center or emergency room at once. NOTE: This medicine is only for you. Do not share this medicine with others. What if I miss a dose? If you miss a dose, skip it. Take your next dose at the normal time. Do not take extra or 2 doses at the same time to make up for the missed dose. What may interact with this medicine? Do not take this medicine with any of the following medications:  any medicines used to stop taking opioids such as methadone or buprenorphine  linezolid  MAOIs like Carbex, Eldepryl, Marplan, Nardil, and Parnate  methylene blue (injected into a vein)  often take narcotic medicines for pain or cough  other medicines that contain bupropion like Zyban or Wellbutrin This medicine may also interact with the following medications:  alcohol  certain medicines for blood pressure like metoprolol, propranolol  certain medicines for depression, anxiety, or psychotic disturbances  certain medicines for HIV or hepatitis  certain medicines for irregular heart beat like propafenone, flecainide  certain medicines for Parkinson's disease like amantadine, levodopa  certain medicines for seizures like carbamazepine, phenytoin, phenobarbital  certain medicines for sleep  cimetidine  clopidogrel  cyclophosphamide  digoxin  disulfiram  furazolidone  isoniazid  nicotine  orphenadrine  procarbazine  steroid medicines like prednisone or cortisone  stimulant medicines for attention disorders, weight loss, or to stay awake  tamoxifen  theophylline  thiotepa  ticlopidine  tramadol  warfarin This list may  not describe all possible interactions. Give your health care provider a list of all the medicines, herbs, non-prescription drugs, or dietary supplements you use. Also tell them if you smoke, drink alcohol, or use illegal drugs. Some items may interact with your medicine. What should I watch for while using this medicine? Visit your doctor or healthcare provider for regular checks on your progress. This medicine may cause serious skin reactions. They can happen weeks to months after starting the medicine. Contact your healthcare provider right away if you notice fevers or flu-like symptoms with a rash. The rash may be red or purple and then turn into blisters or peeling of the skin. Or, you might notice a red rash with swelling of the face, lips or lymph nodes in your neck or under your arms. This medicine may affect blood sugar. Ask your healthcare provider if changes in diet or medicines are needed if you have diabetes. Patients and their families should watch out for new or worsening depression or thoughts of suicide. Also watch out for sudden changes in feelings such as feeling anxious, agitated, panicky, irritable, hostile, aggressive, impulsive, severely restless, overly excited and hyperactive, or not being able to sleep. If this happens, especially at the beginning of treatment or after a change in dose, call your healthcare provider. Avoid alcoholic drinks while taking this medicine. Drinking large amounts of alcoholic beverages, using sleeping or anxiety medicines, or quickly stopping the use of these agents while taking this medicine may increase your risk for a seizure. Do not drive or use heavy machinery until you know how this medicine affects you. This medicine can impair your ability to perform these tasks. Women should inform their health care provider if they wish to become pregnant or think they might be pregnant. Losing weight while pregnant is not advised and may cause harm to the unborn  child. Talk to your health care provider for more information. What side effects may I notice from receiving this medicine? Side effects that you should report to your doctor or health care professional as soon as possible:  allergic reactions like skin rash, itching or hives, swelling of the face, lips, or tongue  breathing problems  changes in vision  confusion  elevated mood, decreased need for sleep, racing thoughts, impulsive behavior  fast or irregular heartbeat  hallucinations, loss of contact with reality  increased blood pressure  rash, fever, and swollen lymph nodes  redness, blistering, peeling, or loosening of the skin, including inside the mouth  seizures  signs and symptoms of liver injury like dark yellow or brown urine; general ill feeling or flu-like symptoms; light-colored stools; loss of appetite; nausea; right upper belly pain; unusually weak or tired; yellowing of the eyes or skin  suicidal thoughts or other mood changes  vomiting Side effects that usually do not require medical attention (report to your doctor or health care professional if they continue or are bothersome):  constipation  headache  loss of appetite  indigestion, stomach upset  tremors This list may not describe all possible side effects. Call your doctor for medical advice about side effects. You may report side effects to FDA at 1-800-FDA-1088. Where should I keep my  medicine? Keep out of the reach of children. Store at room temperature between 15 and 30 degrees C (59 and 86 degrees F). Throw away any unused medicine after the expiration date. NOTE: This sheet is a summary. It may not cover all possible information. If you have questions about this medicine, talk to your doctor, pharmacist, or health care provider.  2020 Elsevier/Gold Standard (2018-11-01 [id number])

## 2019-01-21 ENCOUNTER — Other Ambulatory Visit: Payer: Self-pay

## 2019-01-21 ENCOUNTER — Encounter: Payer: Self-pay | Admitting: Internal Medicine

## 2019-01-21 LAB — BMP8+EGFR
BUN/Creatinine Ratio: 17 (ref 9–23)
BUN: 18 mg/dL (ref 6–24)
CO2: 24 mmol/L (ref 20–29)
Calcium: 9.7 mg/dL (ref 8.7–10.2)
Chloride: 104 mmol/L (ref 96–106)
Creatinine, Ser: 1.09 mg/dL — ABNORMAL HIGH (ref 0.57–1.00)
GFR calc Af Amer: 67 mL/min/{1.73_m2} (ref >59)
GFR calc non Af Amer: 59 mL/min/{1.73_m2} — ABNORMAL LOW (ref >59)
Glucose: 85 mg/dL (ref 65–99)
Potassium: 4.1 mmol/L (ref 3.5–5.2)
Sodium: 141 mmol/L (ref 134–144)

## 2019-01-21 LAB — CK: Total CK: 319 U/L — ABNORMAL HIGH (ref 32–182)

## 2019-01-21 MED ORDER — POTASSIUM CHLORIDE CRYS ER 20 MEQ PO TBCR: EXTENDED_RELEASE_TABLET | ORAL | 1 refills | Status: DC

## 2019-01-22 ENCOUNTER — Encounter: Payer: Self-pay | Admitting: Internal Medicine

## 2019-01-22 LAB — SPECIMEN STATUS REPORT

## 2019-01-22 LAB — ANA W/REFLEX: Anti Nuclear Antibody (ANA): NEGATIVE

## 2019-01-23 ENCOUNTER — Other Ambulatory Visit: Payer: Self-pay | Admitting: Internal Medicine

## 2019-01-23 MED ORDER — OMEPRAZOLE 40 MG PO CPDR: 40.0000 mg | DELAYED_RELEASE_CAPSULE | Freq: Every day | ORAL | 1 refills | Status: DC

## 2019-01-29 ENCOUNTER — Other Ambulatory Visit: Payer: Self-pay

## 2019-01-29 ENCOUNTER — Ambulatory Visit (INDEPENDENT_AMBULATORY_CARE_PROVIDER_SITE_OTHER): Payer: BC Managed Care – PPO | Admitting: Otolaryngology

## 2019-01-29 ENCOUNTER — Encounter (INDEPENDENT_AMBULATORY_CARE_PROVIDER_SITE_OTHER): Payer: Self-pay | Admitting: Otolaryngology

## 2019-01-29 VITALS — Temp 97.9°F

## 2019-01-29 DIAGNOSIS — R040 Epistaxis: Secondary | ICD-10-CM | POA: Diagnosis not present

## 2019-01-29 NOTE — Progress Notes (Signed)
HPI: Erin Barrett is a 54 y.o. female who presents is referred by Dr. Baird Cancer for evaluation of recurrent left-sided epistaxis.  She apparently has had history of nosebleeds mostly on the left side for a number of years.  However over the last 6 months the nosebleeds have become more frequent.  She is referred here for evaluation of recurrent left-sided epistaxis.  She is having no trouble breathing through her nose.  When she has a nosebleed she is able to stop it with application of pressure..  Past Medical History:  Diagnosis Date  . Anxiety   . Bilateral swelling of feet and ankles   . Bursitis of hip    Left  . Cancer ([place])   . CHF (congestive heart failure) ([place])   . Constipation   . Depression   . Drug use   . Heartburn   . Hypertension   . Joint pain   . Lactose intolerance   . Obesity   . Osteoarthritis of both knees   . Shortness of breath   . Sleep apnea   . Vitamin D deficiency    Past Surgical History:  Procedure Laterality Date  . CESAREAN SECTION    . IR RADIOLOGIST EVAL & MGMT  01/10/2017  . KNEE SURGERY Left 2012   Social History   Socioeconomic History  . Marital status: Legally Separated    Spouse name: Not on file  . Number of children: Not on file  . Years of education: Not on file  . Highest education level: Not on file  Occupational History  . Not on file  Tobacco Use  . Smoking status: Former Smoker    Packs/day: 0.33    Years: 34.00    Pack years: 11.22    Start date: 1986    Quit date: 01/1998    Years since quitting: 21.0  . Smokeless tobacco: Never Used  Substance and Sexual Activity  . Alcohol use: Yes    Comment: OCCASIONALLY  . Drug use: Yes    Types: Marijuana  . Sexual activity: Not on file  Other Topics Concern  . Not on file  Social History Narrative  . Not on file   Social Determinants of Health   Financial Resource Strain:   . Difficulty of Paying Living Expenses: Not on file  Food Insecurity:   . Worried About  Charity fundraiser in the Last Year: Not on file  . Ran Out of Food in the Last Year: Not on file  Transportation Needs:   . Lack of Transportation (Medical): Not on file  . Lack of Transportation (Non-Medical): Not on file  Physical Activity:   . Days of Exercise per Week: Not on file  . Minutes of Exercise per Session: Not on file  Stress:   . Feeling of Stress : Not on file  Social Connections:   . Frequency of Communication with Friends and Family: Not on file  . Frequency of Social Gatherings with Friends and Family: Not on file  . Attends Religious Services: Not on file  . Active Member of Clubs or Organizations: Not on file  . Attends Archivist Meetings: Not on file  . Marital Status: Not on file   Family History  Problem Relation Age of Onset  . CVA Mother   . Hypertension Mother   . Heart disease Mother   . Stroke Mother   . Anxiety disorder Mother   . Obesity Mother   . Heart disease Father   .  Hypertension Father   . Sleep apnea Father   . Obesity Father    No Known Allergies Prior to Admission medications   Medication Sig Start Date End Date Taking? Authorizing Provider  amLODipine (NORVASC) 10 MG tablet Take 1 tablet (10 mg total) by mouth daily. 01/20/19  Yes Glendale Chard, MD  busPIRone (BUSPAR) 10 MG tablet Take 10 mg by mouth 2 (two) times daily.   Yes [provider]  enalapril (VASOTEC) 20 MG tablet TAKE 1 TABLET BY MOUTH EVERY DAY 12/16/18  Yes Glendale Chard, MD  ergocalciferol (VITAMIN D2) 1.25 MG (50000 UT) capsule Take 1 capsule (50,000 Units total) by mouth 2 (two) times a week. Patient taking differently: Take 50,000 Units by mouth 2 (two) times a week. 2 times per month 11/07/18  Yes Glendale Chard, MD  furosemide (LASIX) 40 MG tablet TAKE 1 TABLET BY MOUTH EVERY DAY 08/14/18  Yes Glendale Chard, MD  hydrALAZINE (APRESOLINE) 50 MG tablet TAKE 1 TABLET 3 TIMES A DAY WITH FOOD 02/11/18  Yes Glendale Chard, MD  labetalol (NORMODYNE) 200  MG tablet Take 2 tablets (400 mg total) by mouth 2 (two) times daily. 08/26/18  Yes Glendale Chard, MD  Liraglutide -Weight Management (SAXENDA) 18 MG/3ML SOPN Inject into the skin. Inject 1.2mg  intramuscularly daily   Yes [provider]  Magnesium 500 MG CAPS Take by mouth daily.    Yes [provider]  Multiple Vitamins-Minerals (CENTRUM SILVER PO) Take by mouth.   Yes [provider]  Naltrexone-buPROPion HCl ER (CONTRAVE) 8-90 MG TB12 Start 1 tablet every morning for 7 days, then 1 tablet twice daily for 7 days, then 2 tablets every morning and one every evening 01/20/19  Yes Glendale Chard, MD  omeprazole (PRILOSEC) 40 MG capsule Take 1 capsule (40 mg total) by mouth daily. 01/23/19  Yes Glendale Chard, MD  potassium chloride SA (KLOR-CON M20) 20 MEQ tablet TAKE 1 TABLET BY MOUTH EVERY DAY 01/21/19  Yes Glendale Chard, MD     Positive ROS: Otherwise negative  All other systems have been reviewed and were otherwise negative with the exception of those mentioned in the HPI and as above.  Physical Exam: Constitutional: Alert, well-appearing, no acute distress Ears: External ears without lesions or tenderness. Ear canals are clear bilaterally with intact, clear TMs.  Nasal: External nose without lesions. Septum relatively midline..  I did not identify any prominent vessels along the septum on either side that is been bleeding.  However she did have a small vessel on the lateral nasal passageway more superiorly just in front of the left middle turbinate that bled when I removed a small clot from the surface.  This was cauterized using silver nitrate.  She tolerated this well. Oral: Lips and gums without lesions. Tongue and palate mucosa without lesions. Posterior oropharynx clear. Neck: No palpable adenopathy or masses Respiratory: Breathing comfortably  Skin: No facial/neck lesions or rash noted.  Control of epistaxis  Date/Time: 01/29/2019 1:26 PM Performed by:  Rozetta Nunnery, MD Authorized by: Rozetta Nunnery, MD   Consent:    Consent obtained:  Verbal   Consent given by:  Patient   Risks discussed:  Bleeding and pain   Alternatives discussed:  No treatment and observation Anesthesia:    Anesthesia method:  None Procedure details:    Treatment site:  L anterior   Treatment method:  Silver nitrate   Treatment complexity:  Limited Post-procedure details:    Assessment:  Bleeding stopped   Patient  tolerance of procedure:  Tolerated well, no immediate complications Comments:     The area that was cauterized was on the lateral nasal passageway just in front of the left middle turbinate.    Assessment: Left sided epistaxis  Plan: This was cauterized in the office today using silver nitrate. Reviewed with her concerning how to stop nosebleeds if she has any further bleeding. She will follow-up as needed any further bleeding problems.   Radene Journey, MD   CC:

## 2019-02-08 ENCOUNTER — Other Ambulatory Visit (HOSPITAL_COMMUNITY)
Admission: RE | Admit: 2019-02-08 | Discharge: 2019-02-08 | Disposition: A | Discharge status: Home / Self Care | Payer: BC Managed Care – PPO | Source: Ambulatory Visit | Attending: Neurology | Admitting: Neurology

## 2019-02-08 DIAGNOSIS — Z20822 Contact with and (suspected) exposure to covid-19: Secondary | ICD-10-CM | POA: Insufficient documentation

## 2019-02-08 DIAGNOSIS — Z01812 Encounter for preprocedural laboratory examination: Secondary | ICD-10-CM | POA: Insufficient documentation

## 2019-02-08 LAB — SARS CORONAVIRUS 2 (TAT 6-24 HRS): SARS Coronavirus 2: NEGATIVE

## 2019-02-11 ENCOUNTER — Ambulatory Visit (INDEPENDENT_AMBULATORY_CARE_PROVIDER_SITE_OTHER): Payer: BC Managed Care – PPO | Admitting: Neurology

## 2019-02-11 DIAGNOSIS — G4719 Other hypersomnia: Secondary | ICD-10-CM

## 2019-02-11 DIAGNOSIS — I5032 Chronic diastolic (congestive) heart failure: Secondary | ICD-10-CM

## 2019-02-11 DIAGNOSIS — G4733 Obstructive sleep apnea (adult) (pediatric): Secondary | ICD-10-CM

## 2019-02-11 DIAGNOSIS — G4720 Circadian rhythm sleep disorder, unspecified type: Secondary | ICD-10-CM

## 2019-02-12 ENCOUNTER — Other Ambulatory Visit: Payer: Self-pay

## 2019-02-19 NOTE — Addendum Note (Signed)
Addended by: Star Age on: 02/19/2019 07:53 AM   Modules accepted: Orders

## 2019-02-19 NOTE — Progress Notes (Signed)
Patient referred by Dr. Adair Patter, seen by me on 09/18/18, diagnostic PSG on 12/29/18. Patient had a CPAP titration study on 02/11/19.  Please call and inform patient that I have entered an order for treatment with positive airway pressure (PAP) treatment for obstructive sleep apnea (OSA). She did well during the latest sleep study with CPAP. We will, therefore, arrange for a machine for home use through a DME (durable medical equipment) company of Her choice; and I will see the patient back in follow-up in about 10 weeks. Please also explain to the patient that I will be looking out for compliance data, which can be downloaded from the machine (stored on an SD card, that is inserted in the machine) or via remote access through a modem, that is built into the machine. At the time of the followup appointment we will discuss sleep study results and how it is going with PAP treatment at home. Please advise patient to bring Her machine at the time of the first FU visit, even though this is cumbersome. Bringing the machine for every visit after that will likely not be needed, but often helps for the first visit to troubleshoot if needed. Please re-enforce the importance of compliance with treatment and the need for [place] to monitor compliance data - often an insurance requirement and actually good feedback for the patient as far as how they are doing.  Also remind patient, that any interim PAP machine or mask issues should be first addressed with the DME company, as they can often help better with technical and mask fit issues. Please ask if patient has a preference regarding DME company.  Please also make sure, the patient has a follow-up appointment with me in about 10 weeks from the setup date, thanks. May see one of our nurse practitioners if needed for proper timing of the FU appointment.  Please fax or rout report to the referring provider. Thanks,   Star Age, MD, PhD [facility])

## 2019-02-19 NOTE — Procedures (Signed)
PATIENT'S NAME:  Erin Barrett, Erin Barrett DOB:      Dec 16, 1965      MR#:    [id number]     DATE OF RECORDING: 02/11/2019 REFERRING M.D.:  Glendale Chard MD Study Performed:   CPAP  Titration HISTORY: 54 year old with a history of vitamin D deficiency, lactose intolerance, hypertension, depression, anxiety, chronic diastolic CHF, osteoarthritis, L hip bursitis, and morbid obesity with a BMI of over 72, who presents for a full night titration study to treat her OSA. Her baseline sleep study from 12/29/18 showed moderate to severe obstructive sleep apnea, with a total AHI of 20.6/hour, REM AHI of 51/hour, supine AHI of 26.3/hour and O2 nadir of 68%. The patient endorsed the Epworth Sleepiness Scale at 13/24 points. The patient's weight 300 pounds with a height of 67 (inches), resulting in a BMI of 47.1 kg/m2. The patient's neck circumference measured 19.2 inches.  CURRENT MEDICATIONS: Norvasc, Cymbalta, Vasotec, Lasix, Apresoline, Normodyne, Magnesium, Multivitamins, Klor-con, Drisdol.   PROCEDURE:  This is a multichannel digital polysomnogram utilizing the SomnoStar 11.2 system.  Electrodes and sensors were applied and monitored per AASM Specifications.   EEG, EOG, Chin and Limb EMG, were sampled at 200 Hz.  ECG, Snore and Nasal Pressure, Thermal Airflow, Respiratory Effort, CPAP Flow and Pressure, Oximetry was sampled at 50 Hz. Digital video and audio were recorded.      The patient was fitted with a small N30 nasal mask. CPAP was initiated at 5 cmH20 with heated humidity per AASM split night standards and pressure was advanced to 12 cmH20 because of hypopneas, apneas and desaturations.  At a PAP pressure of 12 cmH20, there was a reduction of the AHI to 0/hour with brief supine NREM sleep achieved and O2 nadir of 95%.  Lights Out was at 21:30 and Lights On at 04:58. Total recording time (TRT) was 448 minutes, with a total sleep time (TST) of 400 minutes. The patient's sleep latency was 16 minutes. REM latency was  161.5 minutes, which is delayed. The sleep efficiency was 89.3%.    SLEEP ARCHITECTURE: WASO (Wake after sleep onset) was 38 minutes with mild sleep fragmentation noted. There were 8.5 minutes in Stage N1, 245.5 minutes Stage N2, 82.5 minutes Stage N3 and 63.5 minutes in Stage REM.  The percentage of Stage N1 was 2.1%, Stage N2 was 61.4%, which is increased, Stage N3 was 20.6%, which is normal, and Stage R (REM sleep) was 15.9%, which is mildly reduced. The arousals were noted as: 88 were spontaneous, 2 were associated with PLMs, 11 were associated with respiratory events.  RESPIRATORY ANALYSIS:  There was a total of 16 respiratory events: 3 obstructive apneas, 6 central apneas and 0 mixed apneas with a total of 9 apneas and an apnea index (AHI) of 1.35 /hour. There were 7 hypopneas with a hypopnea index of 1.05/hour. The patient also had 0 respiratory event related arousals (RERAs).      The total APNEA/HYPOPNEA INDEX  (AHI) was 2.4 /hour and the total RESPIRATORY DISTURBANCE INDEX was 2.4 /hour  5 events occurred in REM sleep and 11 events in NREM. The REM AHI was 4.7 /hour versus a non-REM AHI of 2. /hour.  The patient spent 189.5 minutes of total sleep time in the supine position and 211 minutes in non-supine. The supine AHI was 2.2, versus a non-supine AHI of 2.6.  OXYGEN SATURATION & C02:  The baseline 02 saturation was 94%, with the lowest being 89%. Time spent below 89% saturation equaled 0 minutes.  PERIODIC  LIMB MOVEMENTS:  The patient had a total of 5 Periodic Limb Movements. The Periodic Limb Movement (PLM) index was .8 and the PLM Arousal index was .3 /hour.  Audio and video analysis did not show any abnormal or unusual movements, behaviors, phonations or vocalizations. The patient took no bathroom breaks. The EKG was in keeping with normal sinus rhythm. Post-study, the patient indicated that sleep was better than usual.   IMPRESSION:   1. Obstructive Sleep Apnea (OSA) 2. Dysfunctions  associated with sleep stages or arousal from sleep   RECOMMENDATIONS:   1. This study demonstrates resolution of the patient's obstructive sleep apnea with CPAP therapy. I will, therefore, start the patient on home CPAP treatment at a pressure of 12 cm via small nasal mask with heated humidity. The patient should be reminded to be fully compliant with PAP therapy to improve sleep related symptoms and decrease long term cardiovascular risks. The patient should be reminded, that it may take up to 3 months to get fully used to using PAP with all planned sleep. The earlier full compliance is achieved, the better long term compliance tends to be. Please note that untreated obstructive sleep apnea may carry additional perioperative morbidity. Patients with significant obstructive sleep apnea should receive perioperative PAP therapy and the surgeons and particularly the anesthesiologist should be informed of the diagnosis and the severity of the sleep disordered breathing. 2. This study shows mild sleep fragmentation and mildly abnormal sleep stage percentages; these are nonspecific findings and per se do not signify an intrinsic sleep disorder or a cause for the patient's sleep-related symptoms. Causes include (but are not limited to) the first night effect of the sleep study, circadian rhythm disturbances, medication effect or an underlying mood disorder or medical problem.  3. The patient should be cautioned not to drive, work at heights, or operate dangerous or heavy equipment when tired or sleepy. Review and reiteration of good sleep hygiene measures should be pursued with any patient. 4. The patient will be seen in follow-up in the sleep clinic at [facility] for discussion of the test results, symptom and treatment compliance review, further management strategies, etc. The referring provider will be notified of the test results.   I certify that I have reviewed the entire raw data recording prior to the issuance of  this report in accordance with the Standards of Accreditation of the American Academy of Sleep Medicine (AASM)   Star Age, MD, PhD Diplomat, American Board of Neurology and Sleep Medicine (Neurology and Sleep Medicine)

## 2019-02-20 ENCOUNTER — Telehealth: Payer: Self-pay

## 2019-02-20 NOTE — Telephone Encounter (Signed)
-----   Message from Star Age, MD sent at 02/19/2019  7:53 AM EST ----- Patient referred by Dr. Adair Patter, seen by me on 09/18/18, diagnostic PSG on 12/29/18. Patient had a CPAP titration study on 02/11/19.  Please call and inform patient that I have entered an order for treatment with positive airway pressure (PAP) treatment for obstructive sleep apnea (OSA). She did well during the latest sleep study with CPAP. We will, therefore, arrange for a machine for home use through a DME (durable medical equipment) company of Her choice; and I will see the patient back in follow-up in about 10 weeks. Please also explain to the patient that I will be looking out for compliance data, which can be downloaded from the machine (stored on an SD card, that is inserted in the machine) or via remote access through a modem, that is built into the machine. At the time of the followup appointment we will discuss sleep study results and how it is going with PAP treatment at home. Please advise patient to bring Her machine at the time of the first FU visit, even though this is cumbersome. Bringing the machine for every visit after that will likely not be needed, but often helps for the first visit to troubleshoot if needed. Please re-enforce the importance of compliance with treatment and the need for [place] to monitor compliance data - often an insurance requirement and actually good feedback for the patient as far as how they are doing.  Also remind patient, that any interim PAP machine or mask issues should be first addressed with the DME company, as they can often help better with technical and mask fit issues. Please ask if patient has a preference regarding DME company.  Please also make sure, the patient has a follow-up appointment with me in about 10 weeks from the setup date, thanks. May see one of our nurse practitioners if needed for proper timing of the FU appointment.  Please fax or rout report to the referring provider.  Thanks,   Star Age, MD, PhD [facility])

## 2019-02-20 NOTE — Telephone Encounter (Signed)
I reached out to the pt. I discussed that we have submitted a order for CPAP pressure at 12. Pt was agreeable to starting treatment and will use [employer] as her DME. Pt has scheduled her initial cpap f.u for 06/05/2019 at 830. Pt understands we will be looking at her compliance data for this visit and to use her machine at least 4 hours ever night.  Order has been sent to [employer] and letter has been sent to the pt reiterating the information in this phone call.

## 2019-04-07 ENCOUNTER — Other Ambulatory Visit: Payer: Self-pay

## 2019-04-16 IMAGING — MR MR HEAD WO/W CM
11 of 12 series · 43 of 48 positions shown · IV contrast (multihance)
Comparison: None.

CLINICAL DATA: 51-year-old female with left side pulsatile tinnitus
for 3-4 months. No known injury.

Creatinine was obtained on site at [HOSPITAL] at [HOSPITAL].Results: Creatinine 0.9 mg/dL.
EXAM:
MRI HEAD WITHOUT AND WITH CONTRAST
MRV HEAD WITHOUT CONTRAST
TECHNIQUE: Multiplanar, multiecho pulse sequences of the brain and surrounding
structures were obtained without and with intravenous contrast.
Angiographic images of the head were obtained using MRV technique
without contrast.
CONTRAST:  20mL MULTIHANCE GADOBENATE DIMEGLUMINE 529 MG/ML IV SOLN

[Series 5: T1 · sagittal · 4.0mm · 0.72mm/px · 1 of 27 slices shown (1 of 3)]
[im 1/27]
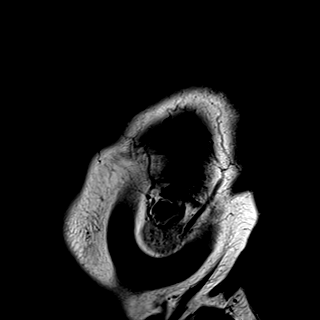

[Series 6: DWI · axial · 3.0mm · 1.44mm/px · z∈[-61,+107]mm · 7 of 88 slices shown (1 of 2)]
[im 1/88]
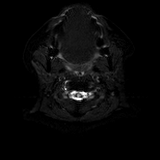
[im 15/88]
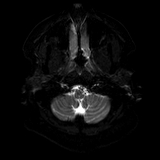
[im 30/88]
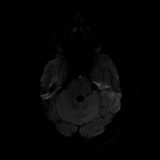
[im 44/88]
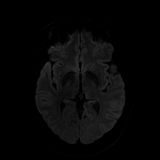
[im 59/88]
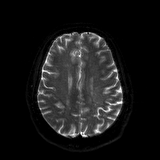
[im 73/88]
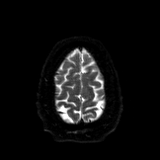
[im 88/88]
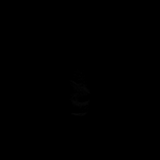

[Series 7: DWI · axial · 3.0mm · 1.44mm/px · z∈[-61,+107]mm · 4 of 44 slices shown (2 of 2)]
[im 1/44]
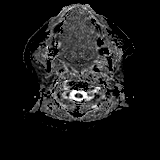
[im 15/44]
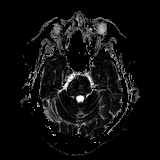
[im 29/44]
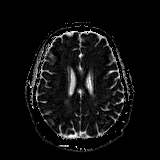
[im 44/44]
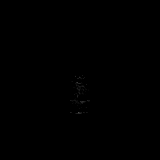

[Series 8: T2 · axial · 4.0mm · 0.36mm/px · z∈[-57,+103]mm · 3 of 32 slices shown]
[im 1/32]
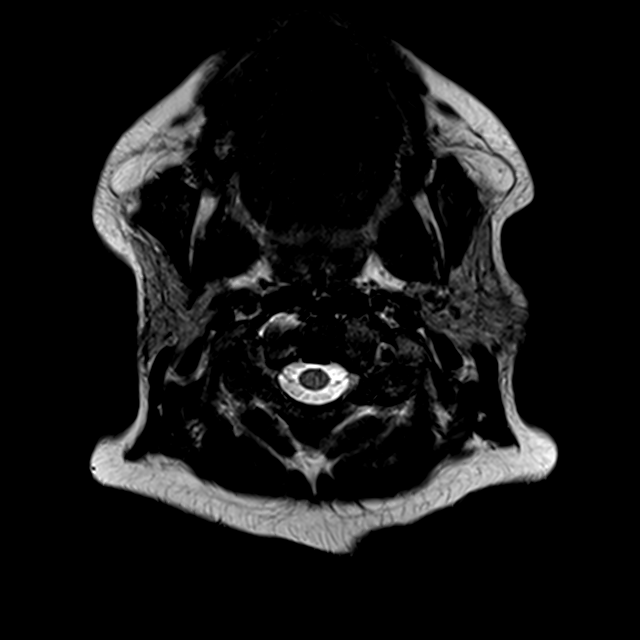
[im 16/32]
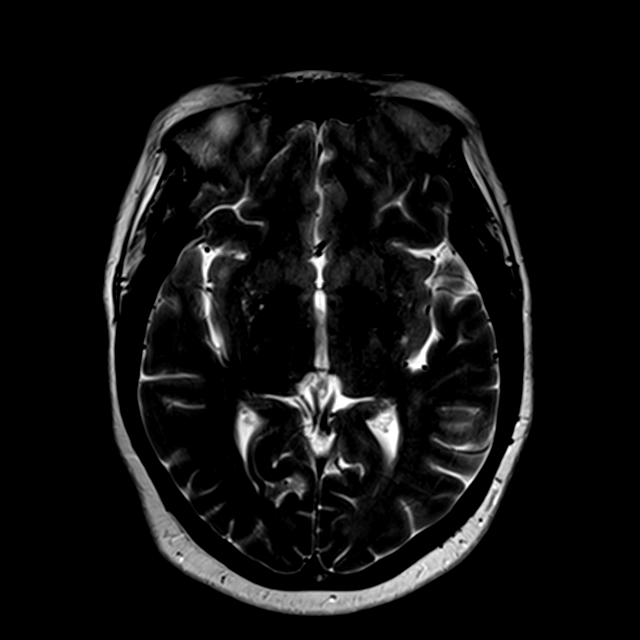
[im 32/32]
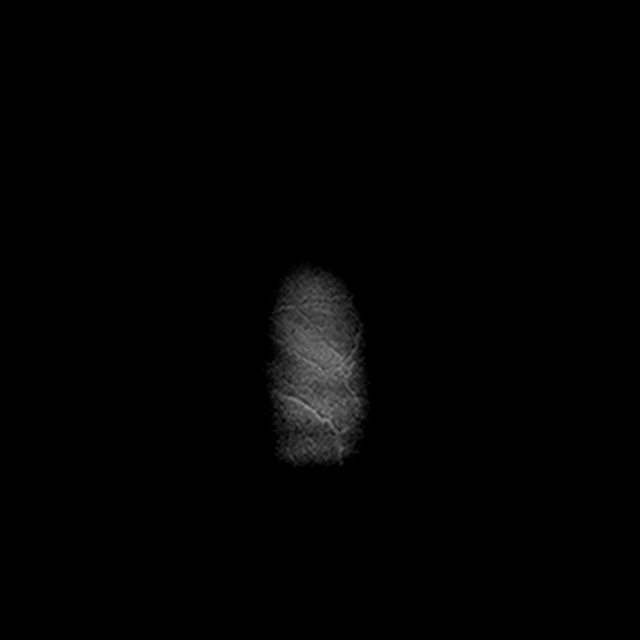

[Series 9: FLAIR · axial · 3.0mm · 0.72mm/px · z∈[-70,+116]mm · 3 of 32 slices shown]
[im 1/32]
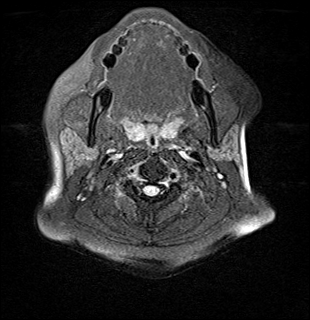
[im 16/32]
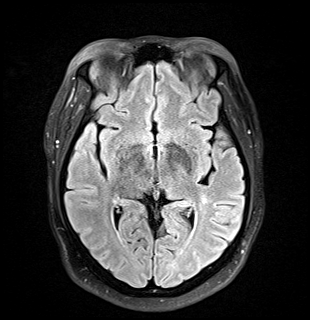
[im 32/32]
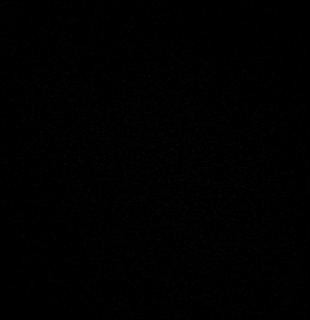

[Series 11: swi_images · axial · 1.5mm · 0.90mm/px · z∈[-54,+100]mm · 8 of 104 slices shown]
[im 1/104]
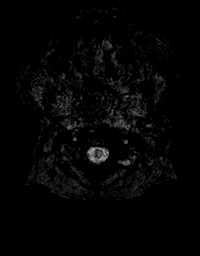
[im 15/104]
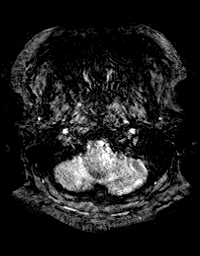
[im 30/104]
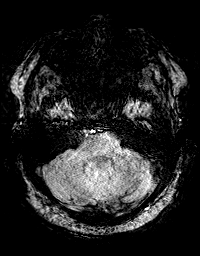
[im 45/104]
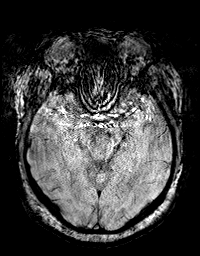
[im 59/104]
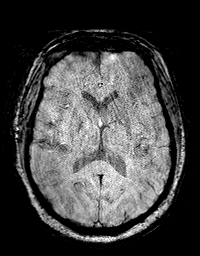
[im 74/104]
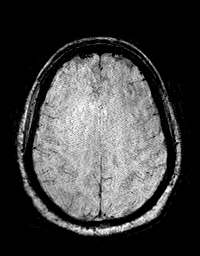
[im 89/104]
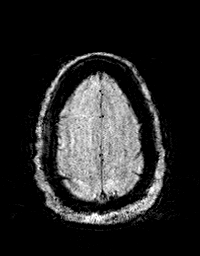
[im 104/104]
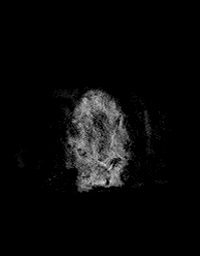

[Series 12: T1 · coronal · 2.5mm · 0.56mm/px · 1 of 13 slices shown (2 of 3)]
[im 1/13]
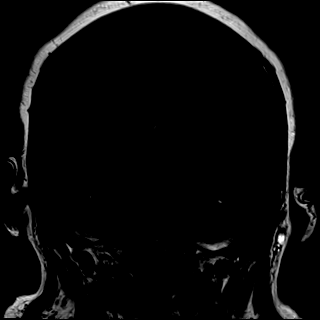

[Series 13: T1 · axial · 2.5mm · 0.50mm/px · 1 of 13 slices shown (3 of 3)]
[im 1/13]
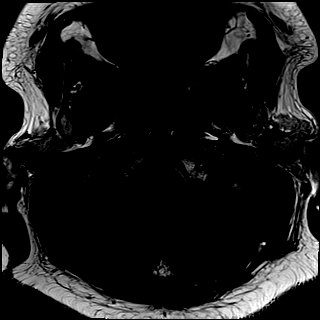

[Series 15: T1 post-contrast · coronal · 2.5mm · 0.56mm/px · 1 of 13 slices shown (1 of 3)]
[im 1/13]
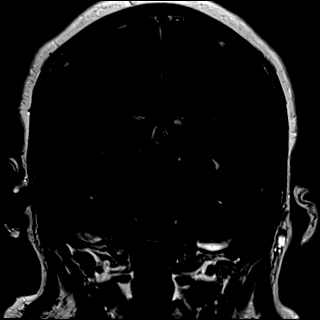

[Series 16: T1 post-contrast · axial · 2.5mm · 0.50mm/px · 1 of 13 slices shown (2 of 3)]
[im 1/13]
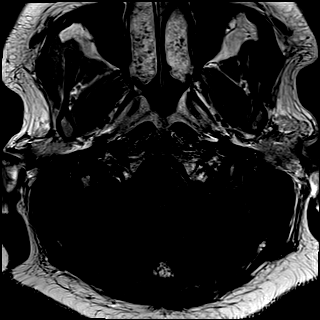

[Series 17: T1 post-contrast · axial · 1.0mm · 0.90mm/px · z∈[-57,+102]mm · 13 of 160 slices shown (3 of 3)]
[im 1/160]
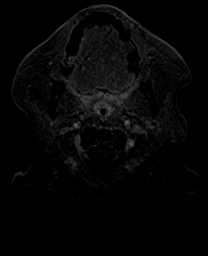
[im 14/160]
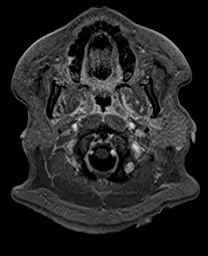
[im 27/160]
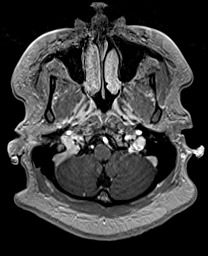
[im 40/160]
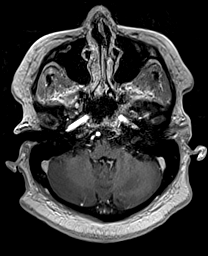
[im 54/160]
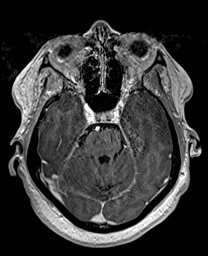
[im 67/160]
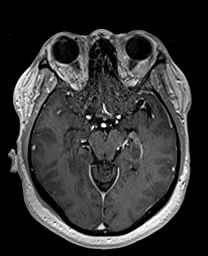
[im 80/160]
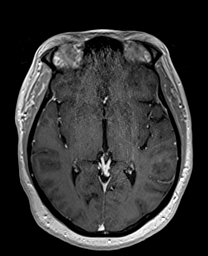
[im 93/160]
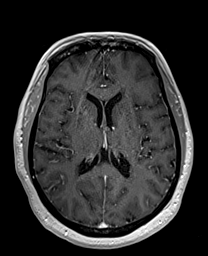
[im 107/160]
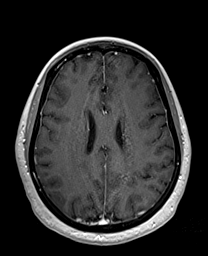
[im 120/160]
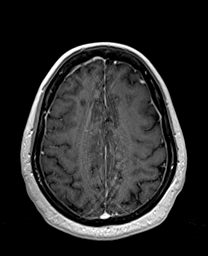
[im 133/160]
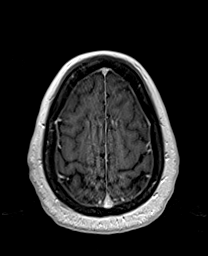
[im 146/160]
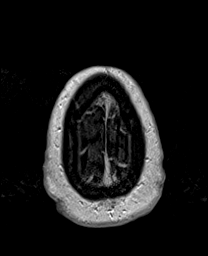
[im 160/160]
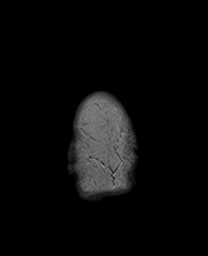

[43 of 48 positions shown; findings below may reference images not displayed]

FINDINGS: MRI HEAD FINDINGS

Brain: Cerebral volume is within normal limits. No restricted
diffusion to suggest acute infarction. No midline shift, mass
effect, evidence of mass lesion, ventriculomegaly, extra-axial
collection or acute intracranial hemorrhage. Cervicomedullary
junction and pituitary are within normal limits.

Widespread scattered and patchy bilateral cerebral white matter T2
and FLAIR hyperintensity. Periventricular and subcortical white
matter involvement in a nonspecific configuration with sparing of
the temporal lobes and corpus callosum. Some of the larger lesions
in the right hemisphere most resemble chronic lacunar infarcts
(series 9, images 19 and 23), including at the right lentiform
nucleus. No cortical encephalomalacia and no chronic cerebral blood
products identified.

Elsewhere the deep gray matter nuclei signal is normal. There is
mild patchy T2 hyperintensity in the bilateral pons. Negative
cerebellum.

No abnormal parenchymal enhancement identified.

Vascular: Major intracranial vascular flow voids are preserved. The
distal left vertebral artery is mildly dominant. The major dural
venous sinuses appear to be enhancing and patent, see also MRV
findings below.

Skull and upper cervical spine: Visualized bone marrow signal is
within normal limits. Negative visualized cervical spine.

Sinuses/Orbits: Tiny mucous retention cysts suspected at the roof of
the sphenoid sinus (series 8, image 12). Other paranasal sinuses are
clear. Negative orbits soft tissues. Scalp and face soft tissues
appear negative.

Other: Dedicated internal auditory canal imaging. Normal
cerebellopontine angles. Normal bilateral cisternal and
intracanalicular 7th and 8th cranial nerve segments. Preserved T2
signal in the bilateral cochlea and vestibular structures. No
abnormal enhancement along the course of the nerves. Normal
stylomastoid foramina. The bilateral mastoids are clear aside from
trace peripheral fluid on the left (series 8, image 8). Both parotid
glands appear normal.

However, there is subtle asymmetric vascular enhancement along the
medial anterior left tentorium just cephalad to the mastoids as seen
on series 17, image 49. This appears to be associated with venous
structures draining into the left tentorial veins and also an
asymmetric left ambient cistern vein which courses cephalad. No
asymmetric enlargement of the left PCA is evident.

MRV HEAD FINDINGS

Preserved flow signal in the superior sagittal sinus, torcula,
straight sinus, vein of Karlita, internal cerebral veins and basal
veins of Abchirovich Elmi. Preserved flow signal in the bilateral
transverse sinuses, sigmoid sinuses, and IJ bulbs.

No abnormal venous flow signal identified about either temporal bone
(series 5, image 54). Only subtle asymmetric venous flow is
demonstrated along the left tentorium area of suspicious
postcontrast enhancement.
IMPRESSION: 1. Subtle asymmetric venous appearing enhancement along the left
tentorium just medial to the superior mastoids with an appearance
raising the possibility of small Dural Fistula in this clinical
setting.
Only subtle associated asymmetry here on the MRV, and no associated
arterial abnormality is evident.
Neuro-Interventional Radiology consultation is suggested to evaluate
the appropriateness of potential treatment. Non-emergent evaluation
can be arranged by calling [phone] during usual hours.
Emergency evaluation can be requested by paging [phone].
2. Otherwise negative intracranial MRV and negative IAC imaging.
3. Superimposed age advanced signal changes in the cerebral white
matter, and to a lesser extent the right basal ganglia and pons,
most suggestive of accelerated chronic small vessel disease.

## 2019-05-14 ENCOUNTER — Ambulatory Visit (INDEPENDENT_AMBULATORY_CARE_PROVIDER_SITE_OTHER): Payer: BC Managed Care – PPO | Admitting: Internal Medicine

## 2019-05-14 ENCOUNTER — Other Ambulatory Visit: Payer: Self-pay

## 2019-05-14 ENCOUNTER — Encounter: Payer: Self-pay | Admitting: Internal Medicine

## 2019-05-14 VITALS — BP 116/84 | HR 83 | Temp 98.3°F | Ht 66.6 in | Wt 310.0 lb

## 2019-05-14 DIAGNOSIS — Z6841 Body Mass Index (BMI) 40.0 and over, adult: Secondary | ICD-10-CM

## 2019-05-14 DIAGNOSIS — M2550 Pain in unspecified joint: Secondary | ICD-10-CM

## 2019-05-14 DIAGNOSIS — E559 Vitamin D deficiency, unspecified: Secondary | ICD-10-CM

## 2019-05-14 DIAGNOSIS — Z79899 Other long term (current) drug therapy: Secondary | ICD-10-CM

## 2019-05-14 DIAGNOSIS — Z0000 Encounter for general adult medical examination without abnormal findings: Secondary | ICD-10-CM | POA: Diagnosis not present

## 2019-05-14 DIAGNOSIS — I5032 Chronic diastolic (congestive) heart failure: Secondary | ICD-10-CM

## 2019-05-14 DIAGNOSIS — I110 Hypertensive heart disease with heart failure: Secondary | ICD-10-CM | POA: Diagnosis not present

## 2019-05-14 LAB — POCT URINALYSIS DIPSTICK
Bilirubin, UA: NEGATIVE
Blood, UA: NEGATIVE
Glucose, UA: NEGATIVE
Ketones, UA: NEGATIVE
Leukocytes, UA: NEGATIVE
Nitrite, UA: NEGATIVE
Protein, UA: NEGATIVE
Spec Grav, UA: 1.015 (ref 1.010–1.025)
Urobilinogen, UA: 0.2 E.U./dL
pH, UA: 5.5 (ref 5.0–8.0)

## 2019-05-14 LAB — POCT UA - MICROALBUMIN
Albumin/Creatinine Ratio, Urine, POC: <30
Creatinine, POC: 200 mg/dL
Microalbumin Ur, POC: 10 mg/L

## 2019-05-14 MED ORDER — DULOXETINE HCL 30 MG PO CPEP
30.0000 mg | ORAL_CAPSULE | Freq: Every day | ORAL | 1 refills | Status: DC
Start: 2019-05-14 — End: 2019-06-08

## 2019-05-14 MED ORDER — VITAMIN D (ERGOCALCIFEROL) 1.25 MG (50000 UNIT) PO CAPS
ORAL_CAPSULE | ORAL | 0 refills | Status: DC
Start: 2019-05-14 — End: 2020-02-04

## 2019-05-14 NOTE — Patient Instructions (Signed)
Health Maintenance, Female Adopting a healthy lifestyle and getting preventive care are important in promoting health and wellness. Ask your health care provider about:  The right schedule for you to have regular tests and exams.  Things you can do on your own to prevent diseases and keep yourself healthy. What should I know about diet, weight, and exercise? Eat a healthy diet   Eat a diet that includes plenty of vegetables, fruits, low-fat dairy products, and lean protein.  Do not eat a lot of foods that are high in solid fats, added sugars, or sodium. Maintain a healthy weight Body mass index (BMI) is used to identify weight problems. It estimates body fat based on height and weight. Your health care provider can help determine your BMI and help you achieve or maintain a healthy weight. Get regular exercise Get regular exercise. This is one of the most important things you can do for your health. Most adults should:  Exercise for at least 150 minutes each week. The exercise should increase your heart rate and make you sweat (moderate-intensity exercise).  Do strengthening exercises at least twice a week. This is in addition to the moderate-intensity exercise.  Spend less time sitting. Even light physical activity can be beneficial. Watch cholesterol and blood lipids Have your blood tested for lipids and cholesterol at 53 years of age, then have this test every 5 years. Have your cholesterol levels checked more often if:  Your lipid or cholesterol levels are high.  You are older than 53 years of age.  You are at high risk for heart disease. What should I know about cancer screening? Depending on your health history and family history, you may need to have cancer screening at various ages. This may include screening for:  Breast cancer.  Cervical cancer.  Colorectal cancer.  Skin cancer.  Lung cancer. What should I know about heart disease, diabetes, and high blood  pressure? Blood pressure and heart disease  High blood pressure causes heart disease and increases the risk of stroke. This is more likely to develop in people who have high blood pressure readings, are of African descent, or are overweight.  Have your blood pressure checked: ? Every 3-5 years if you are 18-39 years of age. ? Every year if you are 40 years old or older. Diabetes Have regular diabetes screenings. This checks your fasting blood sugar level. Have the screening done:  Once every three years after age 40 if you are at a normal weight and have a low risk for diabetes.  More often and at a younger age if you are overweight or have a high risk for diabetes. What should I know about preventing infection? Hepatitis B If you have a higher risk for hepatitis B, you should be screened for this virus. Talk with your health care provider to find out if you are at risk for hepatitis B infection. Hepatitis C Testing is recommended for:  Everyone born from 1945 through 1965.  Anyone with known risk factors for hepatitis C. Sexually transmitted infections (STIs)  Get screened for STIs, including gonorrhea and chlamydia, if: ? You are sexually active and are younger than 53 years of age. ? You are older than 53 years of age and your health care provider tells you that you are at risk for this type of infection. ? Your sexual activity has changed since you were last screened, and you are at increased risk for chlamydia or gonorrhea. Ask your health care provider if   you are at risk.  Ask your health care provider about whether you are at high risk for HIV. Your health care provider may recommend a prescription medicine to help prevent HIV infection. If you choose to take medicine to prevent HIV, you should first get tested for HIV. You should then be tested every 3 months for as long as you are taking the medicine. Pregnancy  If you are about to stop having your period (premenopausal) and  you may become pregnant, seek counseling before you get pregnant.  Take 400 to 800 micrograms (mcg) of folic acid every day if you become pregnant.  Ask for birth control (contraception) if you want to prevent pregnancy. Osteoporosis and menopause Osteoporosis is a disease in which the bones lose minerals and strength with aging. This can result in bone fractures. If you are 65 years old or older, or if you are at risk for osteoporosis and fractures, ask your health care provider if you should:  Be screened for bone loss.  Take a calcium or vitamin D supplement to lower your risk of fractures.  Be given hormone replacement therapy (HRT) to treat symptoms of menopause. Follow these instructions at home: Lifestyle  Do not use any products that contain nicotine or tobacco, such as cigarettes, e-cigarettes, and chewing tobacco. If you need help quitting, ask your health care provider.  Do not use street drugs.  Do not share needles.  Ask your health care provider for help if you need support or information about quitting drugs. Alcohol use  Do not drink alcohol if: ? Your health care provider tells you not to drink. ? You are pregnant, may be pregnant, or are planning to become pregnant.  If you drink alcohol: ? Limit how much you use to 0-1 drink a day. ? Limit intake if you are breastfeeding.  Be aware of how much alcohol is in your drink. In the [place] one drink equals one 12 oz bottle of beer (355 mL), one 5 oz glass of wine (148 mL), or one 1 oz glass of hard liquor (44 mL). General instructions  Schedule regular health, dental, and eye exams.  Stay current with your vaccines.  Tell your health care provider if: ? You often feel depressed. ? You have ever been abused or do not feel safe at home. Summary  Adopting a healthy lifestyle and getting preventive care are important in promoting health and wellness.  Follow your health care provider's instructions about healthy  diet, exercising, and getting tested or screened for diseases.  Follow your health care provider's instructions on monitoring your cholesterol and blood pressure. This information is not intended to replace advice given to you by your health care provider. Make sure you discuss any questions you have with your health care provider. Document Revised: 12/19/2017 Document Reviewed: 12/19/2017 Elsevier Patient Education  2020 Elsevier Inc.  

## 2019-05-14 NOTE — Progress Notes (Signed)
This visit occurred during the SARS-CoV-2 public health emergency.  Safety protocols were in place, including screening questions prior to the visit, additional usage of staff PPE, and extensive cleaning of exam room while observing appropriate contact time as indicated for disinfecting solutions.  Subjective:     Patient ID: Erin Barrett , female    DOB: 04/25/1965 , 54 y.o.   MRN: [id number]   Chief Complaint  Patient presents with  . Annual Exam  . Hypertension    HPI  She is here today for a full physical examination. She has her pap smears performed by GYN for her pelvic exam.   Hypertension This is a chronic problem. The current episode started more than 1 year ago. The problem has been gradually improving since onset. The problem is controlled. Pertinent negatives include no blurred vision, chest pain, palpitations or shortness of breath. Risk factors for coronary artery disease include obesity and post-menopausal state. The current treatment provides moderate improvement.     Past Medical History:  Diagnosis Date  . Anxiety   . Bilateral swelling of feet and ankles   . Bursitis of hip    Left  . Cancer ([place])   . CHF (congestive heart failure) ([place])   . Constipation   . Depression   . Drug use   . Heartburn   . Hypertension   . Joint pain   . Lactose intolerance   . Obesity   . Osteoarthritis of both knees   . Shortness of breath   . Sleep apnea   . Vitamin D deficiency      Family History  Problem Relation Age of Onset  . CVA Mother   . Hypertension Mother   . Heart disease Mother   . Stroke Mother   . Anxiety disorder Mother   . Obesity Mother   . Heart disease Father   . Hypertension Father   . Sleep apnea Father   . Obesity Father      Current Outpatient Medications:  .  amLODipine (NORVASC) 10 MG tablet, Take 1 tablet (10 mg total) by mouth daily., Disp: 90 tablet, Rfl: 1 .  buPROPion (WELLBUTRIN XL) 150 MG 24 hr tablet, Take 450 mg by  mouth daily., Disp: , Rfl:  .  busPIRone (BUSPAR) 30 MG tablet, Take 30 mg by mouth 2 (two) times daily., Disp: , Rfl:  .  enalapril (VASOTEC) 20 MG tablet, TAKE 1 TABLET BY MOUTH EVERY DAY, Disp: 90 tablet, Rfl: 1 .  furosemide (LASIX) 40 MG tablet, TAKE 1 TABLET BY MOUTH EVERY DAY, Disp: 90 tablet, Rfl: 1 .  hydrALAZINE (APRESOLINE) 50 MG tablet, TAKE 1 TABLET 3 TIMES A DAY WITH FOOD, Disp: 270 tablet, Rfl: 2 .  labetalol (NORMODYNE) 200 MG tablet, Take 2 tablets (400 mg total) by mouth 2 (two) times daily., Disp: 360 tablet, Rfl: 5 .  Magnesium 500 MG CAPS, Take by mouth daily. , Disp: , Rfl:  .  Multiple Vitamins-Minerals (CENTRUM SILVER PO), Take by mouth., Disp: , Rfl:  .  omeprazole (PRILOSEC) 40 MG capsule, Take 1 capsule (40 mg total) by mouth daily., Disp: 90 capsule, Rfl: 1 .  potassium chloride SA (KLOR-CON M20) 20 MEQ tablet, TAKE 1 TABLET BY MOUTH EVERY DAY, Disp: 30 tablet, Rfl: 1 .  ergocalciferol (VITAMIN D2) 1.25 MG (50000 UT) capsule, Take 1 capsule (50,000 Units total) by mouth 2 (two) times a week. (Patient not taking: Reported on 05/14/2019), Disp: 24 capsule, Rfl: 0 .  naltrexone (DEPADE) 50 MG tablet, Take 50 mg by mouth daily., Disp: , Rfl:    No Known Allergies    The patient states she uses none for birth control. Last LMP was Patient's last menstrual period was 04/29/2011.. Negative for Dysmenorrhea Negative for: breast discharge, breast lump(s), breast pain and breast self exam. Associated symptoms include abnormal vaginal bleeding. Pertinent negatives include abnormal bleeding (hematology), anxiety, decreased libido, depression, difficulty falling sleep, dyspareunia, history of infertility, nocturia, sexual dysfunction, sleep disturbances, urinary incontinence, urinary urgency, vaginal discharge and vaginal itching. Diet regular.The patient states her exercise level is  moderate.   . The patient's tobacco use is:  Social History   Tobacco Use  Smoking Status Former  Smoker  . Packs/day: 0.33  . Years: 34.00  . Pack years: 11.22  . Start date: 34  . Quit date: 01/1998  . Years since quitting: 21.3  Smokeless Tobacco Never Used  . She has been exposed to passive smoke. The patient's alcohol use is:  Social History   Substance and Sexual Activity  Alcohol Use Yes   Comment: OCCASIONALLY   Review of Systems  Constitutional: Negative.   HENT: Negative.   Eyes: Negative.  Negative for blurred vision.  Respiratory: Negative.  Negative for shortness of breath.   Cardiovascular: Negative.  Negative for chest pain and palpitations.  Endocrine: Negative.   Genitourinary: Negative.   Musculoskeletal: Positive for arthralgias.       She c/o generalized arthralgias. She c/o b/l elbow pain, hip pain and knee pain. She feels as if her sx are worsening.   Skin: Negative.   Allergic/Immunologic: Negative.   Neurological: Negative.   Hematological: Negative.   Psychiatric/Behavioral: Negative.      Today's Vitals   05/14/19 0930  BP: 116/84  Pulse: 83  Temp: 98.3 F (36.8 C)  TempSrc: Oral  Weight: (!) 310 lb (140.6 kg)  Height: 5' 6.6" (1.692 m)   Body mass index is 49.14 kg/m.   Objective:  Physical Exam Vitals and nursing note reviewed.  Constitutional:      Appearance: Normal appearance. She is obese.  HENT:     Head: Normocephalic and atraumatic.     Right Ear: Tympanic membrane, ear canal and external ear normal.     Left Ear: Tympanic membrane, ear canal and external ear normal.     Nose:     Comments: Deferred, masked    Mouth/Throat:     Comments: Deferred, masked Eyes:     Extraocular Movements: Extraocular movements intact.     Conjunctiva/sclera: Conjunctivae normal.     Pupils: Pupils are equal, round, and reactive to light.  Cardiovascular:     Rate and Rhythm: Normal rate and regular rhythm.     Pulses: Normal pulses.     Heart sounds: Normal heart sounds.  Pulmonary:     Effort: Pulmonary effort is normal.      Breath sounds: Normal breath sounds.  Chest:     Breasts: Tanner Score is 5.        Right: Normal.        Left: Normal.  Abdominal:     General: Bowel sounds are normal.     Palpations: Abdomen is soft.  Genitourinary:    Comments: deferred Musculoskeletal:        General: Normal range of motion.     Cervical back: Normal range of motion and neck supple.     Comments: Bilateral knee crepitus  Skin:    General: Skin  is warm and dry.  Neurological:     General: No focal deficit present.     Mental Status: She is alert and oriented to person, place, and time.  Psychiatric:        Mood and Affect: Mood normal.        Behavior: Behavior normal.         Assessment And Plan:     1. Routine general medical examination at health care facility  A full exam was performed.  Importance of monthly self breast exams was discussed with the patient. PATIENT IS ADVISED TO GET 30-45 MINUTES REGULAR EXERCISE NO LESS THAN FOUR TO FIVE DAYS PER WEEK - BOTH WEIGHTBEARING EXERCISES AND AEROBIC ARE RECOMMENDED.  SHE IS ADVISED TO FOLLOW A HEALTHY DIET WITH AT LEAST SIX FRUITS/VEGGIES PER DAY, DECREASE INTAKE OF RED MEAT, AND TO INCREASE FISH INTAKE TO TWO DAYS PER WEEK.  MEATS/FISH SHOULD NOT BE FRIED, BAKED OR BROILED IS PREFERABLE.  I SUGGEST WEARING SPF 50 SUNSCREEN ON EXPOSED PARTS AND ESPECIALLY WHEN IN THE DIRECT SUNLIGHT FOR AN EXTENDED PERIOD OF TIME.  PLEASE AVOID FAST FOOD RESTAURANTS AND INCREASE YOUR WATER INTAKE.  - CMP14+EGFR - CBC - Lipid panel - Hemoglobin G4F - CYCLIC CITRUL PEPTIDE ANTIBODY, IGG/IGA  2. Hypertensive heart disease with chronic diastolic congestive heart failure (HCC)  Chronic, well controlled. She will continue with current meds. EKG performed, NSR w/o acute changes. No new changes noted. She is encouraged to avoid adding salt to her foods. She will rto in six months for re-evaluation.   - POCT Urinalysis Dipstick (81002) - POCT UA - Microalbumin - EKG  12-Lead  3. Arthralgia, unspecified joint  Chronic, I will check arthritis panel today. I will make further recommendations once her labs are available for review. She is encouraged to follow anti-inflammatory diet. She may benefit from ginger/turmeric supplementation.   - Rheumatoid factor - Sedimentation rate - Uric acid  4. Vitamin D deficiency disease  I WILL CHECK A VIT D LEVEL AND SUPPLEMENT AS NEEDED.  ALSO ENCOURAGED TO SPEND 15 MINUTES IN THE SUN DAILY.  - Vitamin D (25 hydroxy)  5. Class 3 severe obesity due to excess calories with serious comorbidity and body mass index (BMI) of 45.0 to 49.9 in adult (HCC)  BMI 49. She agrees to referral to [facility] clinic. She was advised there is a [place] clinic. She will continue to exercise at least 30 minutes five days per week.   6. Drug therapy  - Vitamin B12   [name], MD    THE PATIENT IS ENCOURAGED TO PRACTICE SOCIAL DISTANCING DUE TO THE COVID-19 PANDEMIC.

## 2019-05-15 ENCOUNTER — Encounter: Payer: Self-pay | Admitting: Internal Medicine

## 2019-05-15 ENCOUNTER — Telehealth: Payer: Self-pay

## 2019-05-15 NOTE — Telephone Encounter (Signed)
LOOKED AT DISPENSE HX FOR MEDICATION

## 2019-05-16 LAB — CBC
Hematocrit: 41.6 % (ref 34.0–46.6)
Hemoglobin: 13.8 g/dL (ref 11.1–15.9)
MCH: 30.8 pg (ref 26.6–33.0)
MCHC: 33.2 g/dL (ref 31.5–35.7)
MCV: 93 fL (ref 79–97)
Platelets: 268 10*3/uL (ref 150–450)
RBC: 4.48 x10E6/uL (ref 3.77–5.28)
RDW: 13.6 % (ref 11.7–15.4)
WBC: 5.3 10*3/uL (ref 3.4–10.8)

## 2019-05-16 LAB — CMP14+EGFR
ALT: 15 IU/L (ref 0–32)
AST: 22 IU/L (ref 0–40)
Albumin/Globulin Ratio: 1.8 (ref 1.2–2.2)
Albumin: 4.4 g/dL (ref 3.8–4.9)
Alkaline Phosphatase: 66 IU/L (ref 39–117)
BUN/Creatinine Ratio: 19 (ref 9–23)
BUN: 20 mg/dL (ref 6–24)
Bilirubin Total: 0.3 mg/dL (ref 0.0–1.2)
CO2: 21 mmol/L (ref 20–29)
Calcium: 9.6 mg/dL (ref 8.7–10.2)
Chloride: 104 mmol/L (ref 96–106)
Creatinine, Ser: 1.05 mg/dL — ABNORMAL HIGH (ref 0.57–1.00)
GFR calc Af Amer: 71 mL/min/{1.73_m2} (ref >59)
GFR calc non Af Amer: 61 mL/min/{1.73_m2} (ref >59)
Globulin, Total: 2.5 g/dL (ref 1.5–4.5)
Glucose: 90 mg/dL (ref 65–99)
Potassium: 4.3 mmol/L (ref 3.5–5.2)
Sodium: 141 mmol/L (ref 134–144)
Total Protein: 6.9 g/dL (ref 6.0–8.5)

## 2019-05-16 LAB — LIPID PANEL
Chol/HDL Ratio: 3.8 ratio (ref 0.0–4.4)
Cholesterol, Total: 165 mg/dL (ref 100–199)
HDL: 44 mg/dL (ref >39)
LDL Chol Calc (NIH): 94 mg/dL (ref 0–99)
Triglycerides: 156 mg/dL — ABNORMAL HIGH (ref 0–149)
VLDL Cholesterol Cal: 27 mg/dL (ref 5–40)

## 2019-05-16 LAB — HEMOGLOBIN A1C
Est. average glucose Bld gHb Est-mCnc: 111 mg/dL
Hgb A1c MFr Bld: 5.5 % (ref 4.8–5.6)

## 2019-05-16 LAB — URIC ACID: Uric Acid: 7.9 mg/dL — ABNORMAL HIGH (ref 3.0–7.2)

## 2019-05-16 LAB — VITAMIN B12: Vitamin B-12: 628 pg/mL (ref 232–1245)

## 2019-05-16 LAB — CYCLIC CITRUL PEPTIDE ANTIBODY, IGG/IGA: Cyclic Citrullin Peptide Ab: 10 units (ref 0–19)

## 2019-05-16 LAB — SEDIMENTATION RATE: Sed Rate: 30 mm/hr (ref 0–40)

## 2019-05-16 LAB — RHEUMATOID FACTOR: Rheumatoid fact SerPl-aCnc: <10 IU/mL (ref 0.0–13.9)

## 2019-05-16 LAB — VITAMIN D 25 HYDROXY (VIT D DEFICIENCY, FRACTURES): Vit D, 25-Hydroxy: 37.2 ng/mL (ref 30.0–100.0)

## 2019-05-22 ENCOUNTER — Telehealth: Payer: Self-pay

## 2019-05-22 NOTE — Telephone Encounter (Signed)
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  There was no fax number on the form to fax it to.

## 2019-05-30 ENCOUNTER — Other Ambulatory Visit: Payer: Self-pay | Admitting: Internal Medicine

## 2019-05-30 DIAGNOSIS — I5032 Chronic diastolic (congestive) heart failure: Secondary | ICD-10-CM

## 2019-06-05 ENCOUNTER — Telehealth: Payer: Self-pay

## 2019-06-05 ENCOUNTER — Ambulatory Visit: Payer: Self-pay | Admitting: Adult Health

## 2019-06-05 NOTE — Telephone Encounter (Signed)
2nd attempt to call pt. LVM for pt to bring cpap to appt per [facility] unable to pull data resmed Pt did not check VM  06/04/19  Called pt again  She states she did not bring cpap  Asked to reschedule  06/05/19

## 2019-06-08 ENCOUNTER — Other Ambulatory Visit: Payer: Self-pay | Admitting: Internal Medicine

## 2019-06-11 ENCOUNTER — Encounter: Payer: Self-pay | Admitting: Internal Medicine

## 2019-06-12 ENCOUNTER — Other Ambulatory Visit: Payer: Self-pay | Admitting: Internal Medicine

## 2019-06-17 ENCOUNTER — Telehealth: Payer: Self-pay

## 2019-06-17 ENCOUNTER — Ambulatory Visit: Payer: Self-pay | Admitting: Adult Health

## 2019-06-17 NOTE — Telephone Encounter (Signed)
Attempted to call the pt re: Cpap compliance download.   I spoke to Erin Barrett at [facility] and the patient needs to bring her machine into [facility] to have her downloaded before her appt.   Pt should call [phone] before going to:  [address] [name] [address]

## 2019-09-04 ENCOUNTER — Other Ambulatory Visit: Payer: Self-pay | Admitting: Internal Medicine

## 2019-09-07 ENCOUNTER — Other Ambulatory Visit: Payer: Self-pay | Admitting: Internal Medicine

## 2019-09-22 ENCOUNTER — Telehealth: Payer: Self-pay

## 2019-09-22 NOTE — Telephone Encounter (Signed)
The pt called and wanted a referral to neurology for numbness, the pt was asked if she's seen Dr. Baird Cancer for this in the past and the pt said no but she got a response from an email that was sent in [place] to contact [facility] neurology.  The pt was told that she would need an appt for evaluation.

## 2019-09-23 ENCOUNTER — Encounter: Payer: Self-pay | Admitting: Internal Medicine

## 2019-09-23 ENCOUNTER — Ambulatory Visit (INDEPENDENT_AMBULATORY_CARE_PROVIDER_SITE_OTHER): Payer: BC Managed Care – PPO | Admitting: Internal Medicine

## 2019-09-23 ENCOUNTER — Other Ambulatory Visit: Payer: Self-pay

## 2019-09-23 VITALS — Temp 80.0°F | Ht 66.6 in | Wt 312.4 lb

## 2019-09-23 DIAGNOSIS — L0390 Cellulitis, unspecified: Secondary | ICD-10-CM | POA: Diagnosis not present

## 2019-09-23 DIAGNOSIS — Z6841 Body Mass Index (BMI) 40.0 and over, adult: Secondary | ICD-10-CM

## 2019-09-23 DIAGNOSIS — H8110 Benign paroxysmal vertigo, unspecified ear: Secondary | ICD-10-CM

## 2019-09-23 DIAGNOSIS — Z1159 Encounter for screening for other viral diseases: Secondary | ICD-10-CM

## 2019-09-23 DIAGNOSIS — R202 Paresthesia of skin: Secondary | ICD-10-CM | POA: Diagnosis not present

## 2019-09-23 MED ORDER — DOXYCYCLINE HYCLATE 100 MG PO TABS: 100.0000 mg | ORAL_TABLET | Freq: Two times a day (BID) | ORAL | 0 refills | Status: DC

## 2019-09-23 MED ORDER — CEFTRIAXONE SODIUM 1 G IJ SOLR
1.0000 g | Freq: Once | INTRAMUSCULAR | Status: AC
  Administered 2019-09-23: 1 g via INTRAMUSCULAR

## 2019-09-23 MED ORDER — MECLIZINE HCL 25 MG PO TABS: 25.0000 mg | ORAL_TABLET | Freq: Three times a day (TID) | ORAL | 0 refills | Status: DC | PRN

## 2019-09-23 MED ORDER — METRONIDAZOLE 500 MG PO TABS: ORAL_TABLET | ORAL | 0 refills | Status: DC

## 2019-09-23 NOTE — Progress Notes (Signed)
I,[name],acting as a Education administrator for [name], MD.,have documented all relevant documentation on the behalf of [name], MD,as directed by  [name], MD while in the presence of [name], MD.  This visit occurred during the SARS-CoV-2 public health emergency.  Safety protocols were in place, including screening questions prior to the visit, additional usage of staff PPE, and extensive cleaning of exam room while observing appropriate contact time as indicated for disinfecting solutions.  Subjective:     Patient ID: Erin Barrett , female    DOB: Jan 30, 1965 , 54 y.o.   MRN: [id number]   Chief Complaint  Patient presents with  . Numbness  . Dizziness    HPI  The patient is here today for evaluation of numbness and dizziness.  Dizziness Associated symptoms include numbness (left hand middle fingers, left foot toes for about 1 year.).     Past Medical History:  Diagnosis Date  . Anxiety   . Bilateral swelling of feet and ankles   . Bursitis of hip    Left  . Cancer ([place])   . CHF (congestive heart failure) ([place])   . Constipation   . Depression   . Drug use   . Heartburn   . Hypertension   . Joint pain   . Lactose intolerance   . Obesity   . Osteoarthritis of both knees   . Shortness of breath   . Sleep apnea   . Vitamin D deficiency      Family History  Problem Relation Age of Onset  . CVA Mother   . Hypertension Mother   . Heart disease Mother   . Stroke Mother   . Anxiety disorder Mother   . Obesity Mother   . Heart disease Father   . Hypertension Father   . Sleep apnea Father   . Obesity Father      Current Outpatient Medications:  .  amLODipine (NORVASC) 10 MG tablet, Take 1 tablet (10 mg total) by mouth daily., Disp: 90 tablet, Rfl: 1 .  busPIRone (BUSPAR) 30 MG tablet, Take 30 mg by mouth 2 (two) times daily. 1 tab daily, Disp: , Rfl:  .  DULoxetine (CYMBALTA) 30 MG capsule, TAKE 1 CAPSULE BY MOUTH EVERY DAY, Disp: 90  capsule, Rfl: 1 .  enalapril (VASOTEC) 20 MG tablet, TAKE 1 TABLET BY MOUTH EVERY DAY, Disp: 90 tablet, Rfl: 1 .  furosemide (LASIX) 40 MG tablet, TAKE 1 TABLET BY MOUTH EVERY DAY, Disp: 90 tablet, Rfl: 1 .  hydrALAZINE (APRESOLINE) 50 MG tablet, TAKE 1 TABLET BY MOUTH 3 TIMES A DAY WITH FOOD, Disp: 270 tablet, Rfl: 2 .  labetalol (NORMODYNE) 200 MG tablet, Take 2 tablets (400 mg total) by mouth 2 (two) times daily., Disp: 360 tablet, Rfl: 5 .  Magnesium 500 MG CAPS, Take by mouth daily. , Disp: , Rfl:  .  Multiple Vitamins-Minerals (CENTRUM SILVER PO), Take by mouth., Disp: , Rfl:  .  naltrexone (DEPADE) 50 MG tablet, Take 50 mg by mouth daily., Disp: , Rfl:  .  potassium chloride SA (KLOR-CON M20) 20 MEQ tablet, TAKE 1 TABLET BY MOUTH EVERY DAY, Disp: 90 tablet, Rfl: 1 .  doxycycline (VIBRA-TABS) 100 MG tablet, Take 1 tablet (100 mg total) by mouth 2 (two) times daily., Disp: 20 tablet, Rfl: 0 .  meclizine (ANTIVERT) 25 MG tablet, Take 1 tablet (25 mg total) by mouth 3 (three) times daily as needed for dizziness., Disp: 30 tablet, Rfl: 0 .  metroNIDAZOLE (FLAGYL) 500 MG tablet, Crush tablet and place on wound twice daily. Hold in place with nonstick gauze, Disp: 14 tablet, Rfl: 0 .  omeprazole (PRILOSEC) 40 MG capsule, TAKE 1 CAPSULE BY MOUTH EVERY DAY (Patient not taking: Reported on 09/23/2019), Disp: 90 capsule, Rfl: 1 .  Vitamin D, Ergocalciferol, (DRISDOL) 1.25 MG (50000 UNIT) CAPS capsule, One capsule po twice weekly on Tues/Fridays (Patient not taking: Reported on 09/23/2019), Disp: 24 capsule, Rfl: 0   No Known Allergies   Review of Systems  Constitutional: Negative.   Respiratory: Negative.   Cardiovascular: Negative.   Gastrointestinal: Negative.   Skin: Positive for wound (left ankle for about 2 months.).       At end of visit, pulls pants leg up to show wound on left lower extremity. First noticed it in July, no improvement with her home remedies. Has had similar issue in the past.    Neurological: Positive for dizziness (started about 2 days ago.) and numbness (left hand middle fingers, left foot toes for about 1 year.).       She c/o dizziness with positional changes. Feels as if room is spinning. Denies recent URI.   Psychiatric/Behavioral: Negative.   All other systems reviewed and are negative.    Today's Vitals   09/23/19 1125 09/23/19 1132  Temp: 98.6 F (37 C) (!) 80 F (26.7 C)  TempSrc: Oral   Weight: (!) 312 lb 6.4 oz (141.7 kg)   Height: 5' 6.6" (1.692 m)   PainSc: 4    PainLoc: Ankle    Body mass index is 49.52 kg/m.  Wt Readings from Last 3 Encounters:  09/23/19 (!) 312 lb 6.4 oz (141.7 kg)  05/14/19 (!) 310 lb (140.6 kg)  01/20/19 (!) 314 lb (142.4 kg)   Objective:  Physical Exam Vitals and nursing note reviewed.  Constitutional:      Appearance: Normal appearance. She is obese.  HENT:     Head: Normocephalic and atraumatic.  Cardiovascular:     Rate and Rhythm: Normal rate and regular rhythm.     Heart sounds: Normal heart sounds.  Pulmonary:     Breath sounds: Normal breath sounds.  Skin:    General: Skin is warm.     Comments: Round, well circumscribed wound (about 2 cm) on left lower extremity. Necrotic tissue is present. Slight purulent drainage. Surrounding erythema.   Neurological:     General: No focal deficit present.     Mental Status: She is alert and oriented to person, place, and time.         Assessment And Plan:     1. Paresthesia of both hands Comments: She agrees to Neuro referral for nerve conduction study. I will also check vitamin B12 level today.  - Vitamin B12 - Ambulatory referral to Neurology  2. Benign paroxysmal positional vertigo, unspecified laterality Comments: She was given rx meclizine to use prn. Encouraged to stay well hydrated.   3. Wound cellulitis Comments: Non-healing ulcer. Exacerbated by venous insufficiency.  She was given Ceftriaxone, 1gm IM x 1, rx doxycycline and rx flagyl tabs to  crush and place on wound daily. Importance of medication compliance was stressed to the patient. I will also send her to Wound Clinic for further evaluation.  - cefTRIAXone (ROCEPHIN) injection 1 g - AMB referral to wound care center  4. Class 3 severe obesity due to excess calories with serious comorbidity and body mass index (BMI) of 45.0 to 49.9 in adult [facility]) Comments: BMI 49.  She agrees to referral to [facility].   5. Encounter for HCV screening test for low risk patient Comments: I will check HCV antibody.  - Hepatitis C antibody She is encouraged to strive for BMI less than 30 to decrease cardiac risk. Advised to aim for at least 150 minutes of exercise per week.    Patient was given opportunity to ask questions. Patient verbalized understanding of the plan and was able to repeat key elements of the plan. All questions were answered to their satisfaction.  [name], MD   I, [name], MD, have reviewed all documentation for this visit. The documentation on 10/01/19 for the exam, diagnosis, procedures, and orders are all accurate and complete.  THE PATIENT IS ENCOURAGED TO PRACTICE SOCIAL DISTANCING DUE TO THE COVID-19 PANDEMIC.

## 2019-09-24 LAB — HEPATITIS C ANTIBODY: Hep C Virus Ab: <0.1 s/co ratio (ref 0.0–0.9)

## 2019-09-24 LAB — VITAMIN B12: Vitamin B-12: 609 pg/mL (ref 232–1245)

## 2019-09-25 ENCOUNTER — Encounter: Payer: Self-pay | Admitting: Internal Medicine

## 2019-10-01 NOTE — Patient Instructions (Signed)
Wound Care, Adult Taking care of your wound properly can help to prevent pain, infection, and scarring. It can also help your wound to heal more quickly. How to care for your wound Wound care      Follow instructions from your health care provider about how to take care of your wound. Make sure you: ? Wash your hands with soap and water before you change the bandage (dressing). If soap and water are not available, use hand sanitizer. ? Change your dressing as told by your health care provider. ? Leave stitches (sutures), skin glue, or adhesive strips in place. These skin closures may need to stay in place for 2 weeks or longer. If adhesive strip edges start to loosen and curl up, you may trim the loose edges. Do not remove adhesive strips completely unless your health care provider tells you to do that.  Check your wound area every day for signs of infection. Check for: ? Redness, swelling, or pain. ? Fluid or blood. ? Warmth. ? Pus or a bad smell.  Ask your health care provider if you should clean the wound with mild soap and water. Doing this may include: ? Using a clean towel to pat the wound dry after cleaning it. Do not rub or scrub the wound. ? Applying a cream or ointment. Do this only as told by your health care provider. ? Covering the incision with a clean dressing.  Ask your health care provider when you can leave the wound uncovered.  Keep the dressing dry until your health care provider says it can be removed. Do not take baths, swim, use a hot tub, or do anything that would put the wound underwater until your health care provider approves. Ask your health care provider if you can take showers. You may only be allowed to take sponge baths. Medicines   If you were prescribed an antibiotic medicine, cream, or ointment, take or use the antibiotic as told by your health care provider. Do not stop taking or using the antibiotic even if your condition improves.  Take  over-the-counter and prescription medicines only as told by your health care provider. If you were prescribed pain medicine, take it 30 or more minutes before you do any wound care or as told by your health care provider. General instructions  Return to your normal activities as told by your health care provider. Ask your health care provider what activities are safe.  Do not scratch or pick at the wound.  Do not use any products that contain nicotine or tobacco, such as cigarettes and e-cigarettes. These may delay wound healing. If you need help quitting, ask your health care provider.  Keep all follow-up visits as told by your health care provider. This is important.  Eat a diet that includes protein, vitamin A, vitamin C, and other nutrient-rich foods to help the wound heal. ? Foods rich in protein include meat, dairy, beans, nuts, and other sources. ? Foods rich in vitamin A include carrots and dark green, leafy vegetables. ? Foods rich in vitamin C include citrus, tomatoes, and other fruits and vegetables. ? Nutrient-rich foods have protein, carbohydrates, fat, vitamins, or minerals. Eat a variety of healthy foods including vegetables, fruits, and whole grains. Contact a health care provider if:  You received a tetanus shot and you have swelling, severe pain, redness, or bleeding at the injection site.  Your pain is not controlled with medicine.  You have redness, swelling, or pain around the wound.    You have fluid or blood coming from the wound.  Your wound feels warm to the touch.  You have pus or a bad smell coming from the wound.  You have a fever or chills.  You are nauseous or you vomit.  You are dizzy. Get help right away if:  You have a red streak going away from your wound.  The edges of the wound open up and separate.  Your wound is bleeding, and the bleeding does not stop with gentle pressure.  You have a rash.  You faint.  You have trouble  breathing. Summary  Always wash your hands with soap and water before changing your bandage (dressing).  To help with healing, eat foods that are rich in protein, vitamin A, vitamin C, and other nutrients.  Check your wound every day for signs of infection. Contact your health care provider if you suspect that your wound is infected. This information is not intended to replace advice given to you by your health care provider. Make sure you discuss any questions you have with your health care provider. Document Revised: 04/15/2018 Document Reviewed: 07/13/2015 Elsevier Patient Education  2020 [employer]  

## 2019-10-02 ENCOUNTER — Other Ambulatory Visit: Payer: Self-pay | Admitting: Internal Medicine

## 2019-10-21 ENCOUNTER — Encounter: Payer: Self-pay | Admitting: Neurology

## 2019-10-21 ENCOUNTER — Ambulatory Visit (INDEPENDENT_AMBULATORY_CARE_PROVIDER_SITE_OTHER): Payer: BC Managed Care – PPO | Admitting: Neurology

## 2019-10-21 DIAGNOSIS — G5603 Carpal tunnel syndrome, bilateral upper limbs: Secondary | ICD-10-CM | POA: Diagnosis not present

## 2019-10-21 DIAGNOSIS — G5622 Lesion of ulnar nerve, left upper limb: Secondary | ICD-10-CM | POA: Insufficient documentation

## 2019-10-21 HISTORY — DX: Lesion of ulnar nerve, left upper limb: G56.22

## 2019-10-21 HISTORY — DX: Carpal tunnel syndrome, bilateral upper limbs: G56.03

## 2019-10-21 NOTE — Procedures (Signed)
     HISTORY:  Erin Barrett is a 54 year old patient with a 2-year history of bilateral hand numbness.  The patient reports occasional neck pain without radiation down the arms.  The patient is being evaluated for a possible neuropathy or a cervical radiculopathy.  NERVE CONDUCTION STUDIES:  Nerve conduction studies were performed on both upper extremities.  The distal motor latencies for the median nerves were prolonged bilaterally with normal motor amplitudes for these nerves.  The distal motor latencies for the ulnar nerves were normal bilaterally with a low motor amplitude on the left and normal on the right.  Slowing was seen for the left ulnar nerve above and below the elbow and normal nerve conduction velocities were seen for the right ulnar nerve and for the median nerves bilaterally.  No response was seen for the right median and left ulnar sensory latencies with a prolongation of the left median sensory latency and a normal right ulnar sensory latency and left radial sensory latency.  The F-wave latencies for the ulnar nerves were normal on the right and prolonged on the left.  EMG STUDIES:  EMG study was performed on the left upper extremity:  The first dorsal interosseous muscle reveals 2 to 5 K units with decreased recruitment. No fibrillations or positive waves were noted. The abductor pollicis brevis muscle reveals 2 to 4 K units with full recruitment. No fibrillations or positive waves were noted. The extensor indicis proprius muscle reveals 1 to 3 K units with full recruitment. No fibrillations or positive waves were noted. The flexor digitorum profundus muscle (III-IV) reveals 2 to 3 K units with full recruitment.  No fibrillations or positive waves were noted. The pronator teres muscle reveals 2 to 3 K units with full recruitment. No fibrillations or positive waves were noted. The biceps muscle reveals 1 to 2 K units with full recruitment. No fibrillations or positive waves  were noted. The triceps muscle reveals 2 to 4 K units with full recruitment. No fibrillations or positive waves were noted. The anterior deltoid muscle reveals 2 to 3 K units with full recruitment. No fibrillations or positive waves were noted. The cervical paraspinal muscles were tested at 2 levels. No abnormalities of insertional activity were seen at either level tested. There was good relaxation.   IMPRESSION:  Nerve conduction studies done on both upper extremities shows evidence of bilateral carpal tunnel syndrome of mild severity on the left and moderate severity on the right.  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  EMG evaluation of the left upper extremity shows no evidence of an overlying cervical radiculopathy.  Jill Alexanders MD 10/21/2019 4:29 PM  [facility] [address] [place], [place] [address]  Phone [phone] Fax [phone]

## 2019-10-21 NOTE — Progress Notes (Signed)
Please refer to EMG and nerve conduction procedure note.  

## 2019-10-21 NOTE — Progress Notes (Signed)
[facility]    Nerve / Sites Muscle Latency Ref. Amplitude Ref. Rel Amp Segments Distance Velocity Ref. Area    ms ms mV mV %  cm m/s m/s mVms  R Median - APB     Wrist APB 5.9 ?4.4 8.0 ?4.0 100 Wrist - APB 7   33.9     Upper arm APB 10.6  5.6  70.7 Upper arm - Wrist 23 49 ?49 28.4  L Median - APB     Wrist APB 5.4 ?4.4 7.5 ?4.0 100 Wrist - APB 7   27.9     Upper arm APB 10.1  7.6  102 Upper arm - Wrist 23 49 ?49 30.0  R Ulnar - ADM     Wrist ADM 2.8 ?3.3 8.5 ?6.0 100 Wrist - ADM 7   29.1     B.Elbow ADM 6.7  9.9  115 B.Elbow - Wrist 20 52 ?49 33.9     A.Elbow ADM 8.6  10.1  102 A.Elbow - B.Elbow 10 52 ?49 33.0         A.Elbow - Wrist      L Ulnar - ADM     Wrist ADM 3.0 ?3.3 5.3 ?6.0 100 Wrist - ADM 7   17.6     B.Elbow ADM 7.6  5.0  93.7 B.Elbow - Wrist 20 43 ?49 18.4     A.Elbow ADM 10.1  4.4  88.9 A.Elbow - B.Elbow 10 40 ?49 18.1         A.Elbow - Wrist                 SNC    Nerve / Sites Rec. Site Peak Lat Ref.  Amp Ref. Segments Distance    ms ms V V  cm  L Radial - Anatomical snuff box (Forearm)     Forearm Wrist 2.3 ?2.9 15 ?15 Forearm - Wrist 10  R Median - Orthodromic (Dig II, Mid palm)     Dig II Wrist NR ?3.4 NR ?10 Dig II - Wrist 13  L Median - Orthodromic (Dig II, Mid palm)     Dig II Wrist 6.0 ?3.4 4 ?10 Dig II - Wrist 13  R Ulnar - Orthodromic, (Dig V, Mid palm)     Dig V Wrist 2.7 ?3.1 5 ?5 Dig V - Wrist 11  L Ulnar - Orthodromic, (Dig V, Mid palm)     Dig V Wrist NR ?3.1 NR ?5 Dig V - Wrist 69               F  Wave    Nerve F Lat Ref.   ms ms  R Ulnar - ADM 31.7 ?32.0  L Ulnar - ADM 35.8 ?32.0

## 2019-10-27 ENCOUNTER — Encounter: Payer: Self-pay | Admitting: Internal Medicine

## 2019-10-28 ENCOUNTER — Other Ambulatory Visit: Payer: Self-pay | Admitting: Internal Medicine

## 2019-10-28 DIAGNOSIS — G5603 Carpal tunnel syndrome, bilateral upper limbs: Secondary | ICD-10-CM

## 2019-10-28 DIAGNOSIS — R202 Paresthesia of skin: Secondary | ICD-10-CM

## 2019-10-30 ENCOUNTER — Encounter (HOSPITAL_BASED_OUTPATIENT_CLINIC_OR_DEPARTMENT_OTHER): Payer: BC Managed Care – PPO | Admitting: Internal Medicine

## 2019-11-24 ENCOUNTER — Encounter: Payer: Self-pay | Admitting: Internal Medicine

## 2019-11-24 ENCOUNTER — Ambulatory Visit (INDEPENDENT_AMBULATORY_CARE_PROVIDER_SITE_OTHER): Payer: BC Managed Care – PPO | Admitting: Internal Medicine

## 2019-11-24 ENCOUNTER — Other Ambulatory Visit: Payer: Self-pay

## 2019-11-24 VITALS — BP 116/82 | HR 79 | Temp 99.6°F | Ht 66.6 in | Wt 325.6 lb

## 2019-11-24 DIAGNOSIS — Z23 Encounter for immunization: Secondary | ICD-10-CM | POA: Diagnosis not present

## 2019-11-24 DIAGNOSIS — F4321 Adjustment disorder with depressed mood: Secondary | ICD-10-CM

## 2019-11-24 DIAGNOSIS — M5416 Radiculopathy, lumbar region: Secondary | ICD-10-CM

## 2019-11-24 DIAGNOSIS — I5032 Chronic diastolic (congestive) heart failure: Secondary | ICD-10-CM

## 2019-11-24 DIAGNOSIS — E66813 Obesity, class 3: Secondary | ICD-10-CM

## 2019-11-24 DIAGNOSIS — Z6841 Body Mass Index (BMI) 40.0 and over, adult: Secondary | ICD-10-CM

## 2019-11-24 DIAGNOSIS — I110 Hypertensive heart disease with heart failure: Secondary | ICD-10-CM | POA: Diagnosis not present

## 2019-11-24 LAB — BMP8+EGFR
BUN/Creatinine Ratio: 13 (ref 9–23)
BUN: 13 mg/dL (ref 6–24)
CO2: 24 mmol/L (ref 20–29)
Calcium: 9.5 mg/dL (ref 8.7–10.2)
Chloride: 107 mmol/L — ABNORMAL HIGH (ref 96–106)
Creatinine, Ser: 1.02 mg/dL — ABNORMAL HIGH (ref 0.57–1.00)
GFR calc Af Amer: 73 mL/min/{1.73_m2} (ref >59)
GFR calc non Af Amer: 63 mL/min/{1.73_m2} (ref >59)
Glucose: 89 mg/dL (ref 65–99)
Potassium: 3.9 mmol/L (ref 3.5–5.2)
Sodium: 145 mmol/L — ABNORMAL HIGH (ref 134–144)

## 2019-11-24 MED ORDER — HYDRALAZINE HCL 50 MG PO TABS
ORAL_TABLET | ORAL | 2 refills | Status: DC
Start: 2019-11-24 — End: 2020-05-19

## 2019-11-24 MED ORDER — KETOROLAC TROMETHAMINE 60 MG/2ML IM SOLN
60.0000 mg | Freq: Once | INTRAMUSCULAR | Status: AC
  Administered 2019-11-24: 60 mg via INTRAMUSCULAR

## 2019-11-24 MED ORDER — POTASSIUM CHLORIDE CRYS ER 20 MEQ PO TBCR
EXTENDED_RELEASE_TABLET | ORAL | 1 refills | Status: DC
Start: 2019-11-24 — End: 2020-05-19

## 2019-11-24 MED ORDER — LABETALOL HCL 200 MG PO TABS
400.0000 mg | ORAL_TABLET | Freq: Two times a day (BID) | ORAL | 5 refills | Status: DC
Start: 2019-11-24 — End: 2020-05-19

## 2019-11-24 MED ORDER — ENALAPRIL MALEATE 20 MG PO TABS
20.0000 mg | ORAL_TABLET | Freq: Every day | ORAL | 1 refills | Status: DC
Start: 2019-11-24 — End: 2020-05-19

## 2019-11-24 MED ORDER — DULOXETINE HCL 30 MG PO CPEP: ORAL_CAPSULE | ORAL | 1 refills | Status: DC

## 2019-11-24 NOTE — Progress Notes (Signed)
I,Erin Barrett,acting as a Education administrator for Erin Greenland, MD.,have documented all relevant documentation on the behalf of Erin Greenland, MD,as directed by  Erin Greenland, MD while in the presence of Erin Greenland, MD.  This visit occurred during the SARS-CoV-2 public health emergency.  Safety protocols were in place, including screening questions prior to the visit, additional usage of staff PPE, and extensive cleaning of exam room while observing appropriate contact time as indicated for disinfecting solutions.  Subjective:     Patient ID: Erin Barrett , female    DOB: 03/05/1965 , 54 y.o.   MRN: [id number]   Chief Complaint  Patient presents with  . Hypertension  . Back Pain    HPI  The patient is here today for a blood pressure follow-up. She reports compliance with meds.  She is most bothered by lower back pain. Described as dull, throbbing pain.   Hypertension This is a chronic problem. The current episode started more than 1 year ago. The problem has been gradually improving since onset. The problem is controlled. Pertinent negatives include no blurred vision, chest pain, headaches, palpitations or shortness of breath. The current treatment provides moderate improvement.  Back Pain This is a chronic problem. The current episode started more than 1 year ago. The pain is present in the gluteal. The quality of the pain is described as aching. The pain is at a severity of 6/10. The pain is moderate. Associated symptoms include numbness (left hand middle fingers, left foot toes for about 1 year.) and tingling. Pertinent negatives include no chest pain, headaches, perianal numbness or weakness. Risk factors include menopause and obesity.     Past Medical History:  Diagnosis Date  . Anxiety   . Bilateral carpal tunnel syndrome 10/21/2019  . Bilateral swelling of feet and ankles   . Bursitis of hip    Left  . Cancer ([place])   . CHF (congestive heart failure) ([place])   .  Constipation   . Depression   . Drug use   . Heartburn   . Hypertension   . Joint pain   . Lactose intolerance   . Obesity   . Osteoarthritis of both knees   . Shortness of breath   . Sleep apnea   . Ulnar neuropathy at elbow, left 10/21/2019  . Vitamin D deficiency      Family History  Problem Relation Age of Onset  . CVA Mother   . Hypertension Mother   . Heart disease Mother   . Stroke Mother   . Anxiety disorder Mother   . Obesity Mother   . Heart disease Father   . Hypertension Father   . Sleep apnea Father   . Obesity Father      Current Outpatient Medications:  .  amLODipine (NORVASC) 10 MG tablet, Take 1 tablet (10 mg total) by mouth daily., Disp: 90 tablet, Rfl: 1 .  busPIRone (BUSPAR) 30 MG tablet, Take 30 mg by mouth 2 (two) times daily. 1 tab daily, Disp: , Rfl:  .  DULoxetine (CYMBALTA) 30 MG capsule, TAKE 1 CAPSULE BY MOUTH EVERY DAY, Disp: 90 capsule, Rfl: 1 .  enalapril (VASOTEC) 20 MG tablet, Take 1 tablet (20 mg total) by mouth daily., Disp: 90 tablet, Rfl: 1 .  furosemide (LASIX) 40 MG tablet, TAKE 1 TABLET BY MOUTH EVERY DAY, Disp: 90 tablet, Rfl: 1 .  hydrALAZINE (APRESOLINE) 50 MG tablet, TAKE 1 TABLET BY MOUTH 3 TIMES A DAY WITH FOOD, Disp: 270  tablet, Rfl: 2 .  labetalol (NORMODYNE) 200 MG tablet, Take 2 tablets (400 mg total) by mouth 2 (two) times daily., Disp: 360 tablet, Rfl: 5 .  Magnesium 500 MG CAPS, Take by mouth daily. , Disp: , Rfl:  .  meclizine (ANTIVERT) 25 MG tablet, Take 1 tablet (25 mg total) by mouth 3 (three) times daily as needed for dizziness., Disp: 30 tablet, Rfl: 0 .  metroNIDAZOLE (FLAGYL) 500 MG tablet, Crush tablet and place on wound twice daily. Hold in place with nonstick gauze, Disp: 14 tablet, Rfl: 0 .  Multiple Vitamins-Minerals (CENTRUM SILVER PO), Take by mouth., Disp: , Rfl:  .  naltrexone (DEPADE) 50 MG tablet, Take 50 mg by mouth daily., Disp: , Rfl:  .  potassium chloride SA (KLOR-CON M20) 20 MEQ tablet, TAKE 1  TABLET BY MOUTH EVERY DAY, Disp: 90 tablet, Rfl: 1 .  Vitamin D, Ergocalciferol, (DRISDOL) 1.25 MG (50000 UNIT) CAPS capsule, One capsule po twice weekly on Tues/Fridays (Patient not taking: Reported on 09/23/2019), Disp: 24 capsule, Rfl: 0   No Known Allergies   Review of Systems  Constitutional: Negative.   Eyes: Negative for blurred vision.  Respiratory: Negative.  Negative for shortness of breath.   Cardiovascular: Negative.  Negative for chest pain and palpitations.  Gastrointestinal: Negative.   Musculoskeletal: Positive for back pain.  Skin: Positive for wound (left ankle for about 2 months.).       At end of visit, pulls pants leg up to show wound on left lower extremity. First noticed it in July, no improvement with her home remedies. Has had similar issue in the past.   Neurological: Positive for tingling and numbness (left hand middle fingers, left foot toes for about 1 year.). Negative for weakness and headaches. Dizziness: started about 2 days ago.  Psychiatric/Behavioral: Negative.   All other systems reviewed and are negative.    Today's Vitals   11/24/19 0842  BP: 116/82  Pulse: 79  Temp: 99.6 F (37.6 C)  TempSrc: Oral  Weight: (!) 325 lb 9.6 oz (147.7 kg)  Height: 5' 6.6" (1.692 m)   Body mass index is 51.61 kg/m.  Wt Readings from Last 3 Encounters:  11/24/19 (!) 325 lb 9.6 oz (147.7 kg)  09/23/19 (!) 312 lb 6.4 oz (141.7 kg)  05/14/19 (!) 310 lb (140.6 kg)   Objective:  Physical Exam Vitals and nursing note reviewed.  Constitutional:      Appearance: Normal appearance. She is obese.  HENT:     Head: Normocephalic and atraumatic.  Cardiovascular:     Rate and Rhythm: Normal rate and regular rhythm.     Heart sounds: Normal heart sounds.  Pulmonary:     Breath sounds: Normal breath sounds.  Musculoskeletal:        General: No tenderness.     Comments: No spinal tenderness.   Skin:    General: Skin is warm.  Neurological:     General: No focal  deficit present.     Mental Status: She is alert and oriented to person, place, and time.         Assessment And Plan:     1. Hypertensive heart disease with chronic diastolic congestive heart failure ([place]) Comments: Chronic, well controlled. She will continue with current meds.  She is encouraged to follow a low sodium diet.  - BMP8+EGFR  2. Lumbar back pain with radiculopathy affecting lower extremity Comments: She was given Toradol, 57m IM x 1. She agrees to MRI lumbar  spine for further evaluation of her sx.  - MR Lumbar Spine Wo Contrast; Future - ketorolac (TORADOL) injection 60 mg  3. Adjustment disorder with depressed mood Comments: We discussed increasing her duloxetine. We will discuss after her upcoming surgery.  4. Class 3 severe obesity due to excess calories with serious comorbidity and body mass index (BMI) of 50.0 to 59.9 in adult [facility]) Comments: BMI 51. She is encouraged to focus on losing 25 pounds to decrease cardiac risk. Advised to increase activity as tolerated .  5. Need for influenza vaccination Comments: She was given flu vaccine.  - Flu Vaccine QUAD 6+ mos PF IM (Fluarix Quad PF) She is encouraged to strive for BMI less than 30 to decrease cardiac risk. Advised to aim for at least 150 minutes of exercise per week.    Patient was given opportunity to ask questions. Patient verbalized understanding of the plan and was able to repeat key elements of the plan. All questions were answered to their satisfaction.  Erin Greenland, MD   I, Erin Greenland, MD, have reviewed all documentation for this visit. The documentation on 11/27/19 for the exam, diagnosis, procedures, and orders are all accurate and complete.  THE PATIENT IS ENCOURAGED TO PRACTICE SOCIAL DISTANCING DUE TO THE COVID-19 PANDEMIC.

## 2019-11-24 NOTE — Patient Instructions (Signed)

## 2019-12-15 ENCOUNTER — Other Ambulatory Visit: Payer: Self-pay | Admitting: Internal Medicine

## 2019-12-15 ENCOUNTER — Encounter: Payer: Self-pay | Admitting: Internal Medicine

## 2019-12-15 DIAGNOSIS — M5416 Radiculopathy, lumbar region: Secondary | ICD-10-CM

## 2019-12-17 ENCOUNTER — Other Ambulatory Visit: Payer: BC Managed Care – PPO

## 2020-01-04 ENCOUNTER — Other Ambulatory Visit: Payer: Self-pay | Admitting: Internal Medicine

## 2020-02-04 ENCOUNTER — Other Ambulatory Visit: Payer: Self-pay | Admitting: Internal Medicine

## 2020-02-09 ENCOUNTER — Encounter: Payer: Self-pay | Admitting: Internal Medicine

## 2020-04-20 ENCOUNTER — Other Ambulatory Visit: Payer: Self-pay | Admitting: Internal Medicine

## 2020-05-19 ENCOUNTER — Encounter: Payer: Self-pay | Admitting: Internal Medicine

## 2020-05-19 ENCOUNTER — Ambulatory Visit (INDEPENDENT_AMBULATORY_CARE_PROVIDER_SITE_OTHER): Payer: BC Managed Care – PPO | Admitting: Internal Medicine

## 2020-05-19 ENCOUNTER — Other Ambulatory Visit: Payer: Self-pay

## 2020-05-19 VITALS — BP 150/108 | Temp 98.8°F | Ht 66.0 in | Wt 313.2 lb

## 2020-05-19 DIAGNOSIS — M7062 Trochanteric bursitis, left hip: Secondary | ICD-10-CM

## 2020-05-19 DIAGNOSIS — G8929 Other chronic pain: Secondary | ICD-10-CM

## 2020-05-19 DIAGNOSIS — Z0000 Encounter for general adult medical examination without abnormal findings: Secondary | ICD-10-CM

## 2020-05-19 DIAGNOSIS — I10 Essential (primary) hypertension: Secondary | ICD-10-CM

## 2020-05-19 DIAGNOSIS — M25562 Pain in left knee: Secondary | ICD-10-CM

## 2020-05-19 DIAGNOSIS — I5032 Chronic diastolic (congestive) heart failure: Secondary | ICD-10-CM

## 2020-05-19 DIAGNOSIS — Z6841 Body Mass Index (BMI) 40.0 and over, adult: Secondary | ICD-10-CM

## 2020-05-19 LAB — POCT URINALYSIS DIPSTICK
Bilirubin, UA: NEGATIVE
Blood, UA: NEGATIVE
Glucose, UA: NEGATIVE
Ketones, UA: NEGATIVE
Leukocytes, UA: NEGATIVE
Nitrite, UA: NEGATIVE
Protein, UA: NEGATIVE
Spec Grav, UA: 1.025 (ref 1.010–1.025)
Urobilinogen, UA: 0.2 E.U./dL
pH, UA: 5.5 (ref 5.0–8.0)

## 2020-05-19 LAB — POCT UA - MICROALBUMIN
Albumin/Creatinine Ratio, Urine, POC: <30
Creatinine, POC: 300 mg/dL
Microalbumin Ur, POC: 10 mg/L

## 2020-05-19 MED ORDER — POTASSIUM CHLORIDE CRYS ER 20 MEQ PO TBCR
EXTENDED_RELEASE_TABLET | ORAL | 1 refills | Status: AC
Start: 2020-05-19 — End: ?

## 2020-05-19 MED ORDER — DULOXETINE HCL 30 MG PO CPEP: ORAL_CAPSULE | ORAL | 1 refills | Status: DC

## 2020-05-19 MED ORDER — VITAMIN D (ERGOCALCIFEROL) 1.25 MG (50000 UNIT) PO CAPS: ORAL_CAPSULE | ORAL | 1 refills | Status: DC

## 2020-05-19 MED ORDER — HYDRALAZINE HCL 50 MG PO TABS: ORAL_TABLET | ORAL | 2 refills | Status: DC

## 2020-05-19 MED ORDER — FUROSEMIDE 40 MG PO TABS: 40.0000 mg | ORAL_TABLET | Freq: Every day | ORAL | 1 refills | Status: DC

## 2020-05-19 MED ORDER — MECLIZINE HCL 25 MG PO TABS: 25.0000 mg | ORAL_TABLET | Freq: Three times a day (TID) | ORAL | 1 refills | Status: DC | PRN

## 2020-05-19 MED ORDER — NALTREXONE HCL 50 MG PO TABS: 50.0000 mg | ORAL_TABLET | Freq: Every day | ORAL | 1 refills | Status: DC

## 2020-05-19 MED ORDER — ENALAPRIL MALEATE 20 MG PO TABS: 20.0000 mg | ORAL_TABLET | Freq: Every day | ORAL | 1 refills | Status: DC

## 2020-05-19 MED ORDER — AMLODIPINE BESYLATE 10 MG PO TABS: 1.0000 | ORAL_TABLET | Freq: Every day | ORAL | 1 refills | Status: DC

## 2020-05-19 MED ORDER — LABETALOL HCL 200 MG PO TABS
400.0000 mg | ORAL_TABLET | Freq: Two times a day (BID) | ORAL | 1 refills | Status: DC
Start: 2020-05-19 — End: 2021-06-15

## 2020-05-19 MED ORDER — BUSPIRONE HCL 30 MG PO TABS: 30.0000 mg | ORAL_TABLET | Freq: Two times a day (BID) | ORAL | 1 refills | Status: DC

## 2020-05-19 MED ORDER — MAGNESIUM 500 MG PO CAPS: 1.0000 | ORAL_CAPSULE | Freq: Every day | ORAL | 1 refills | Status: AC

## 2020-05-19 NOTE — Progress Notes (Signed)
I,Erin Barrett,acting as a scribe for Erin Greenland, MD.,have documented all relevant documentation on the behalf of Erin Greenland, MD,as directed by  Erin Greenland, MD while in the presence of Erin Greenland, MD. This visit occurred during the SARS-CoV-2 public health emergency.  Safety protocols were in place, including screening questions prior to the visit, additional usage of staff PPE, and extensive cleaning of exam room while observing appropriate contact time as indicated for disinfecting solutions.  Subjective:     Patient ID: Erin Barrett , female    DOB: 02-02-1965 , 55 y.o.   MRN: [id number]   Chief Complaint  Patient presents with  . Annual Exam  . Hypertension    HPI  She is here today for a full physical examination. She is followed by [facility] for her pelvic exam.  She is no longer working. She has resigned from [employer]. She plans to get another job in the near future.  She admits she is not taking all meds. Needs refill on BP meds, but would like to discuss lower cost alternatives.   Hypertension This is a chronic problem. The current episode started more than 1 year ago. The problem has been gradually improving since onset. The problem is controlled. Pertinent negatives include no blurred vision, chest pain, palpitations or shortness of breath. Risk factors for coronary artery disease include obesity and post-menopausal state. The current treatment provides moderate improvement.     Past Medical History:  Diagnosis Date  . Anxiety   . Bilateral carpal tunnel syndrome 10/21/2019  . Bilateral swelling of feet and ankles   . Bursitis of hip    Left  . Cancer ([place])   . CHF (congestive heart failure) ([place])   . Constipation   . Depression   . Drug use   . Heartburn   . Hypertension   . Joint pain   . Lactose intolerance   . Obesity   . Osteoarthritis of both knees   . Shortness of breath   . Sleep apnea   . Ulnar neuropathy at elbow, left  10/21/2019  . Vitamin D deficiency      Family History  Problem Relation Age of Onset  . CVA Mother   . Hypertension Mother   . Heart disease Mother   . Stroke Mother   . Anxiety disorder Mother   . Obesity Mother   . Heart disease Father   . Hypertension Father   . Sleep apnea Father   . Obesity Father      Current Outpatient Medications:  Marland Kitchen  Multiple Vitamins-Minerals (CENTRUM SILVER PO), Take by mouth., Disp: , Rfl:  .  amLODipine (NORVASC) 10 MG tablet, Take 1 tablet (10 mg total) by mouth daily., Disp: 90 tablet, Rfl: 1 .  busPIRone (BUSPAR) 30 MG tablet, Take 1 tablet (30 mg total) by mouth 2 (two) times daily. 1 tab daily, Disp: 180 tablet, Rfl: 1 .  DULoxetine (CYMBALTA) 30 MG capsule, TAKE 1 CAPSULE BY MOUTH EVERY DAY, Disp: 90 capsule, Rfl: 1 .  enalapril (VASOTEC) 20 MG tablet, Take 1 tablet (20 mg total) by mouth daily., Disp: 90 tablet, Rfl: 1 .  furosemide (LASIX) 40 MG tablet, Take 1 tablet (40 mg total) by mouth daily., Disp: 90 tablet, Rfl: 1 .  hydrALAZINE (APRESOLINE) 50 MG tablet, TAKE 1 TABLET BY MOUTH 3 TIMES A DAY WITH FOOD, Disp: 270 tablet, Rfl: 2 .  labetalol (NORMODYNE) 200 MG tablet, Take 2 tablets (400 mg total)  by mouth 2 (two) times daily., Disp: 180 tablet, Rfl: 1 .  Magnesium 500 MG CAPS, Take 1 capsule (500 mg total) by mouth daily., Disp: 90 capsule, Rfl: 1 .  meclizine (ANTIVERT) 25 MG tablet, Take 1 tablet (25 mg total) by mouth 3 (three) times daily as needed for dizziness., Disp: 90 tablet, Rfl: 1 .  naltrexone (DEPADE) 50 MG tablet, Take 1 tablet (50 mg total) by mouth daily., Disp: 90 tablet, Rfl: 1 .  potassium chloride SA (KLOR-CON M20) 20 MEQ tablet, TAKE 1 TABLET BY MOUTH EVERY DAY, Disp: 90 tablet, Rfl: 1 .  Vitamin D, Ergocalciferol, (DRISDOL) 1.25 MG (50000 UNIT) CAPS capsule, Take one tablet once weekly., Disp: 24 capsule, Rfl: 1   No Known Allergies    The patient states she uses none for birth control. Last LMP was Patient's last  menstrual period was 04/29/2011.. Negative for Dysmenorrhea. Negative for: breast discharge, breast lump(s), breast pain and breast self exam. Associated symptoms include abnormal vaginal bleeding. Pertinent negatives include abnormal bleeding (hematology), anxiety, decreased libido, depression, difficulty falling sleep, dyspareunia, history of infertility, nocturia, sexual dysfunction, sleep disturbances, urinary incontinence, urinary urgency, vaginal discharge and vaginal itching. Diet regular.The patient states her exercise level is  moderate.  . The patient's tobacco use is:  Social History   Tobacco Use  Smoking Status Former Smoker  . Packs/day: 0.33  . Years: 34.00  . Pack years: 11.22  . Start date: 43  . Quit date: 01/1998  . Years since quitting: 22.4  Smokeless Tobacco Never Used  . She has been exposed to passive smoke. The patient's alcohol use is:  Social History   Substance and Sexual Activity  Alcohol Use Yes   Comment: OCCASIONALLY   Review of Systems  Constitutional: Negative.   HENT: Negative.   Eyes: Negative.  Negative for blurred vision.  Respiratory: Negative.  Negative for shortness of breath.   Cardiovascular: Negative.  Negative for chest pain and palpitations.  Gastrointestinal: Negative.   Endocrine: Negative.   Genitourinary: Negative.   Musculoskeletal: Positive for arthralgias.       She c/o left hip pain and left knee pain. She is followed by Dr. Veverly Fells at [facility] for bursitis. Denies fall/recent trauma. These are chronic issues.   Skin: Negative.   Allergic/Immunologic: Negative.   Neurological: Negative.   Hematological: Negative.   Psychiatric/Behavioral: Negative.      Today's Vitals   05/19/20 1006  BP: (!) 150/108  Temp: 98.8 F (37.1 C)  Weight: (!) 313 lb 3.2 oz (142.1 kg)  Height: 5' 6"  (1.676 m)  PainSc: 0-No pain   Body mass index is 50.55 kg/m.  Wt Readings from Last 3 Encounters:  06/01/20 (!) 315 lb 6.4 oz (143.1  kg)  05/19/20 (!) 313 lb 3.2 oz (142.1 kg)  11/24/19 (!) 325 lb 9.6 oz (147.7 kg)   Objective:  Physical Exam Vitals and nursing note reviewed.  Constitutional:      Appearance: Normal appearance. She is obese.  HENT:     Head: Normocephalic and atraumatic.     Right Ear: Tympanic membrane, ear canal and external ear normal.     Left Ear: Tympanic membrane, ear canal and external ear normal.     Nose:     Comments: Masked     Mouth/Throat:     Comments: Masked  Eyes:     Extraocular Movements: Extraocular movements intact.     Conjunctiva/sclera: Conjunctivae normal.     Pupils: Pupils are equal,  round, and reactive to light.  Cardiovascular:     Rate and Rhythm: Normal rate and regular rhythm.     Pulses: Normal pulses.     Heart sounds: Normal heart sounds.  Pulmonary:     Effort: Pulmonary effort is normal.     Breath sounds: Normal breath sounds.  Chest:  Breasts:     Tanner Score is 5.     Right: Normal.     Left: Normal.    Abdominal:     General: Bowel sounds are normal.     Palpations: Abdomen is soft.     Comments: Obese, soft, difficult to assess organomegaly  Genitourinary:    Comments: deferred Musculoskeletal:        General: Normal range of motion.     Cervical back: Normal range of motion and neck supple.     Comments: Crepitus b/l knees  Skin:    General: Skin is warm and dry.  Neurological:     General: No focal deficit present.     Mental Status: She is alert and oriented to person, place, and time.  Psychiatric:        Mood and Affect: Mood normal.        Behavior: Behavior normal.       Assessment And Plan:     1. Routine general medical examination at health care facility Comments: A full exam was performed. Importance of monthly self breast exams was discussed with the patient.  PATIENT IS ADVISED TO GET 30-45 MINUTES REGULAR EXERCISE NO LESS THAN FOUR TO FIVE DAYS PER WEEK - BOTH WEIGHTBEARING EXERCISES AND AEROBIC ARE RECOMMENDED.   PATIENT IS ADVISED TO FOLLOW A HEALTHY DIET WITH AT LEAST SIX FRUITS/VEGGIES PER DAY, DECREASE INTAKE OF RED MEAT, AND TO INCREASE FISH INTAKE TO TWO DAYS PER WEEK.  MEATS/FISH SHOULD NOT BE FRIED, BAKED OR BROILED IS PREFERABLE.  IT IS ALSO IMPORTANT TO CUT BACK ON YOUR SUGAR INTAKE. PLEASE AVOID ANYTHING WITH ADDED SUGAR, CORN SYRUP OR OTHER SWEETENERS. IF YOU MUST USE A SWEETENER, YOU CAN TRY STEVIA. IT IS ALSO IMPORTANT TO AVOID ARTIFICIALLY SWEETENERS AND DIET BEVERAGES. LASTLY, I SUGGEST WEARING SPF 50 SUNSCREEN ON EXPOSED PARTS AND ESPECIALLY WHEN IN THE DIRECT SUNLIGHT FOR AN EXTENDED PERIOD OF TIME.  PLEASE AVOID FAST FOOD RESTAURANTS AND INCREASE YOUR WATER INTAKE.  - CBC - Lipid panel - CMP14+EGFR - Insulin, random(561)  2. Essential hypertension Comments: Chronic, uncontrolled. EKG performed, NSR w/ nonspecific ST depression and nonspecific T abnormality. She is without chest pain or SOB. We discussed use of Goodrx card for cost savings. I used the [employer] to determine cost of each medication. She was given printout of rx to take to [facility], [facility], [facility] or [facility]. She will let me know if she has any issues with rx. Encouraged to take meds as prescribed, follow low sodium diet and to exercise regularly. - POCT urinalysis dipstick - POCT UA - Microalbumin - EKG 12-Lead  3. Trochanteric bursitis of left hip Comments: Chronic, having current flare. Advised to follow anti-inflammatory diet. She will f/u w/ Ortho should her sx persist.   4. Chronic pain of left knee Comments: Her sx are possibly due to OA. Again, she has been eval by Ortho in the past. May benefit from steroid injection vs. viscosupplementation.   5. Class 3 severe obesity due to excess calories with serious comorbidity and body mass index (BMI) of 50.0 to 59.9 in adult [facility]) Comments: BMI 50. She is  encouraged to consider water aerobics since this will have less impact on her joints.   Patient was  given opportunity to ask questions. Patient verbalized understanding of the plan and was able to repeat key elements of the plan. All questions were answered to their satisfaction.   I, Erin Greenland, MD, have reviewed all documentation for this visit. The documentation on 05/19/20 for the exam, diagnosis, procedures, and orders are all accurate and complete.  THE PATIENT IS ENCOURAGED TO PRACTICE SOCIAL DISTANCING DUE TO THE COVID-19 PANDEMIC.

## 2020-05-19 NOTE — Patient Instructions (Signed)
Health Maintenance, Female Adopting a healthy lifestyle and getting preventive care are important in promoting health and wellness. Ask your health care provider about:  The right schedule for you to have regular tests and exams.  Things you can do on your own to prevent diseases and keep yourself healthy. What should I know about diet, weight, and exercise? Eat a healthy diet  Eat a diet that includes plenty of vegetables, fruits, low-fat dairy products, and lean protein.  Do not eat a lot of foods that are high in solid fats, added sugars, or sodium.   Maintain a healthy weight Body mass index (BMI) is used to identify weight problems. It estimates body fat based on height and weight. Your health care provider can help determine your BMI and help you achieve or maintain a healthy weight. Get regular exercise Get regular exercise. This is one of the most important things you can do for your health. Most adults should:  Exercise for at least 150 minutes each week. The exercise should increase your heart rate and make you sweat (moderate-intensity exercise).  Do strengthening exercises at least twice a week. This is in addition to the moderate-intensity exercise.  Spend less time sitting. Even light physical activity can be beneficial. Watch cholesterol and blood lipids Have your blood tested for lipids and cholesterol at 54 years of age, then have this test every 5 years. Have your cholesterol levels checked more often if:  Your lipid or cholesterol levels are high.  You are older than 54 years of age.  You are at high risk for heart disease. What should I know about cancer screening? Depending on your health history and family history, you may need to have cancer screening at various ages. This may include screening for:  Breast cancer.  Cervical cancer.  Colorectal cancer.  Skin cancer.  Lung cancer. What should I know about heart disease, diabetes, and high blood  pressure? Blood pressure and heart disease  High blood pressure causes heart disease and increases the risk of stroke. This is more likely to develop in people who have high blood pressure readings, are of African descent, or are overweight.  Have your blood pressure checked: ? Every 3-5 years if you are 18-39 years of age. ? Every year if you are 40 years old or older. Diabetes Have regular diabetes screenings. This checks your fasting blood sugar level. Have the screening done:  Once every three years after age 40 if you are at a normal weight and have a low risk for diabetes.  More often and at a younger age if you are overweight or have a high risk for diabetes. What should I know about preventing infection? Hepatitis B If you have a higher risk for hepatitis B, you should be screened for this virus. Talk with your health care provider to find out if you are at risk for hepatitis B infection. Hepatitis C Testing is recommended for:  Everyone born from 1945 through 1965.  Anyone with known risk factors for hepatitis C. Sexually transmitted infections (STIs)  Get screened for STIs, including gonorrhea and chlamydia, if: ? You are sexually active and are younger than 54 years of age. ? You are older than 54 years of age and your health care provider tells you that you are at risk for this type of infection. ? Your sexual activity has changed since you were last screened, and you are at increased risk for chlamydia or gonorrhea. Ask your health care provider   if you are at risk.  Ask your health care provider about whether you are at high risk for HIV. Your health care provider may recommend a prescription medicine to help prevent HIV infection. If you choose to take medicine to prevent HIV, you should first get tested for HIV. You should then be tested every 3 months for as long as you are taking the medicine. Pregnancy  If you are about to stop having your period (premenopausal) and  you may become pregnant, seek counseling before you get pregnant.  Take 400 to 800 micrograms (mcg) of folic acid every day if you become pregnant.  Ask for birth control (contraception) if you want to prevent pregnancy. Osteoporosis and menopause Osteoporosis is a disease in which the bones lose minerals and strength with aging. This can result in bone fractures. If you are 65 years old or older, or if you are at risk for osteoporosis and fractures, ask your health care provider if you should:  Be screened for bone loss.  Take a calcium or vitamin D supplement to lower your risk of fractures.  Be given hormone replacement therapy (HRT) to treat symptoms of menopause. Follow these instructions at home: Lifestyle  Do not use any products that contain nicotine or tobacco, such as cigarettes, e-cigarettes, and chewing tobacco. If you need help quitting, ask your health care provider.  Do not use street drugs.  Do not share needles.  Ask your health care provider for help if you need support or information about quitting drugs. Alcohol use  Do not drink alcohol if: ? Your health care provider tells you not to drink. ? You are pregnant, may be pregnant, or are planning to become pregnant.  If you drink alcohol: ? Limit how much you use to 0-1 drink a day. ? Limit intake if you are breastfeeding.  Be aware of how much alcohol is in your drink. In the [place] one drink equals one 12 oz bottle of beer (355 mL), one 5 oz glass of wine (148 mL), or one 1 oz glass of hard liquor (44 mL). General instructions  Schedule regular health, dental, and eye exams.  Stay current with your vaccines.  Tell your health care provider if: ? You often feel depressed. ? You have ever been abused or do not feel safe at home. Summary  Adopting a healthy lifestyle and getting preventive care are important in promoting health and wellness.  Follow your health care provider's instructions about healthy  diet, exercising, and getting tested or screened for diseases.  Follow your health care provider's instructions on monitoring your cholesterol and blood pressure. This information is not intended to replace advice given to you by your health care provider. Make sure you discuss any questions you have with your health care provider. Document Revised: 12/19/2017 Document Reviewed: 12/19/2017 Elsevier Patient Education  2021 Elsevier Inc.  

## 2020-06-01 ENCOUNTER — Ambulatory Visit: Payer: Self-pay

## 2020-06-01 ENCOUNTER — Other Ambulatory Visit: Payer: Self-pay

## 2020-06-01 VITALS — BP 120/70 | HR 75 | Temp 98.9°F | Ht 66.0 in | Wt 315.4 lb

## 2020-06-01 DIAGNOSIS — I10 Essential (primary) hypertension: Secondary | ICD-10-CM

## 2020-06-01 NOTE — Progress Notes (Signed)
Patient presents today for a bp check. Patient advised what blood pressure. She will f/u with provider. YL,RMA

## 2020-11-22 ENCOUNTER — Encounter: Payer: Self-pay | Admitting: Internal Medicine

## 2020-11-22 NOTE — Progress Notes (Deleted)
[name],acting as a Education administrator for [name], MD.,have documented all relevant documentation on the behalf of [name], MD,as directed by  [name], MD while in the presence of [name], MD.  This visit occurred during the SARS-CoV-2 public health emergency.  Safety protocols were in place, including screening questions prior to the visit, additional usage of staff PPE, and extensive cleaning of exam room while observing appropriate contact time as indicated for disinfecting solutions.  Subjective:     Patient ID: Erin Barrett , female    DOB: 01/31/1965 , 55 y.o.   MRN: [id number]   No chief complaint on file.   HPI  The patient is here today for a blood pressure follow-up. She reports compliance with meds.   Hypertension This is a chronic problem. The current episode started more than 1 year ago. The problem has been gradually improving since onset. The problem is controlled. Pertinent negatives include no blurred vision, chest pain, headaches, palpitations or shortness of breath. The current treatment provides moderate improvement.  Back Pain This is a chronic problem. The current episode started more than 1 year ago. The pain is present in the gluteal. The quality of the pain is described as aching. The pain is at a severity of 6/10. The pain is moderate. Associated symptoms include numbness (left hand middle fingers, left foot toes for about 1 year.) and tingling. Pertinent negatives include no chest pain, headaches, perianal numbness or weakness. Risk factors include menopause and obesity.    Past Medical History:  Diagnosis Date   Anxiety    Bilateral carpal tunnel syndrome 10/21/2019   Bilateral swelling of feet and ankles    Bursitis of hip    Left   Cancer ([facility])    CHF (congestive heart failure) ([facility])    Constipation    Depression    Drug use    Heartburn    Hypertension    Joint pain    Lactose intolerance    Obesity     Osteoarthritis of both knees    Shortness of breath    Sleep apnea    Ulnar neuropathy at elbow, left 10/21/2019   Vitamin D deficiency      Family History  Problem Relation Age of Onset   CVA Mother    Hypertension Mother    Heart disease Mother    Stroke Mother    Anxiety disorder Mother    Obesity Mother    Heart disease Father    Hypertension Father    Sleep apnea Father    Obesity Father      Current Outpatient Medications:    amLODipine (NORVASC) 10 MG tablet, Take 1 tablet (10 mg total) by mouth daily., Disp: 90 tablet, Rfl: 1   busPIRone (BUSPAR) 30 MG tablet, Take 1 tablet (30 mg total) by mouth 2 (two) times daily. 1 tab daily, Disp: 180 tablet, Rfl: 1   DULoxetine (CYMBALTA) 30 MG capsule, TAKE 1 CAPSULE BY MOUTH EVERY DAY, Disp: 90 capsule, Rfl: 1   enalapril (VASOTEC) 20 MG tablet, Take 1 tablet (20 mg total) by mouth daily., Disp: 90 tablet, Rfl: 1   furosemide (LASIX) 40 MG tablet, Take 1 tablet (40 mg total) by mouth daily., Disp: 90 tablet, Rfl: 1   hydrALAZINE (APRESOLINE) 50 MG tablet, TAKE 1 TABLET BY MOUTH 3 TIMES A DAY WITH FOOD, Disp: 270 tablet, Rfl: 2   labetalol (NORMODYNE) 200 MG tablet, Take 2 tablets (400 mg total) by  mouth 2 (two) times daily., Disp: 180 tablet, Rfl: 1   Magnesium 500 MG CAPS, Take 1 capsule (500 mg total) by mouth daily., Disp: 90 capsule, Rfl: 1   meclizine (ANTIVERT) 25 MG tablet, Take 1 tablet (25 mg total) by mouth 3 (three) times daily as needed for dizziness., Disp: 90 tablet, Rfl: 1   Multiple Vitamins-Minerals (CENTRUM SILVER PO), Take by mouth., Disp: , Rfl:    naltrexone (DEPADE) 50 MG tablet, Take 1 tablet (50 mg total) by mouth daily., Disp: 90 tablet, Rfl: 1   potassium chloride SA (KLOR-CON M20) 20 MEQ tablet, TAKE 1 TABLET BY MOUTH EVERY DAY, Disp: 90 tablet, Rfl: 1   Vitamin D, Ergocalciferol, (DRISDOL) 1.25 MG (50000 UNIT) CAPS capsule, Take one tablet once weekly., Disp: 24 capsule, Rfl: 1   No Known Allergies    Review of Systems  Eyes:  Negative for blurred vision.  Respiratory:  Negative for shortness of breath.   Cardiovascular:  Negative for chest pain and palpitations.  Musculoskeletal:  Positive for back pain.  Neurological:  Positive for tingling and numbness (left hand middle fingers, left foot toes for about 1 year.). Negative for weakness and headaches.    There were no vitals filed for this visit. There is no height or weight on file to calculate BMI.   Objective:  Physical Exam      Assessment And Plan:     1. Essential hypertension     Patient was given opportunity to ask questions. Patient verbalized understanding of the plan and was able to repeat key elements of the plan. All questions were answered to their satisfaction.  Sheppard Evens Llittleton, CMA   I, [place], CMA, have reviewed all documentation for this visit. The documentation on 11/22/20 for the exam, diagnosis, procedures, and orders are all accurate and complete.   IF YOU HAVE BEEN REFERRED TO A SPECIALIST, IT MAY TAKE 1-2 WEEKS TO SCHEDULE/PROCESS THE REFERRAL. IF YOU HAVE NOT HEARD FROM US/SPECIALIST IN TWO WEEKS, PLEASE GIVE [place] A CALL AT [phone].   THE PATIENT IS ENCOURAGED TO PRACTICE SOCIAL DISTANCING DUE TO THE COVID-19 PANDEMIC.

## 2020-11-23 NOTE — Progress Notes (Signed)
erroneous

## 2021-02-10 ENCOUNTER — Other Ambulatory Visit: Payer: Self-pay

## 2021-02-10 ENCOUNTER — Encounter: Payer: Self-pay | Admitting: Internal Medicine

## 2021-02-10 ENCOUNTER — Ambulatory Visit: Payer: No Typology Code available for payment source | Admitting: Internal Medicine

## 2021-02-10 VITALS — BP 120/70 | HR 72 | Temp 98.9°F | Ht 66.0 in | Wt 315.2 lb

## 2021-02-10 DIAGNOSIS — E559 Vitamin D deficiency, unspecified: Secondary | ICD-10-CM

## 2021-02-10 DIAGNOSIS — Z23 Encounter for immunization: Secondary | ICD-10-CM | POA: Diagnosis not present

## 2021-02-10 DIAGNOSIS — Z6841 Body Mass Index (BMI) 40.0 and over, adult: Secondary | ICD-10-CM | POA: Diagnosis not present

## 2021-02-10 DIAGNOSIS — I10 Essential (primary) hypertension: Secondary | ICD-10-CM | POA: Diagnosis not present

## 2021-02-10 DIAGNOSIS — Z780 Asymptomatic menopausal state: Secondary | ICD-10-CM

## 2021-02-10 MED ORDER — WEGOVY 0.5 MG/0.5ML ~~LOC~~ SOAJ: 0.5000 mg | SUBCUTANEOUS | 1 refills | Status: DC

## 2021-02-10 NOTE — Progress Notes (Signed)
[name],acting as a Education administrator for [name], MD.,have documented all relevant documentation on the behalf of [name], MD,as directed by  [name], MD while in the presence of [name], MD.  This visit occurred during the SARS-CoV-2 public health emergency.  Safety protocols were in place, including screening questions prior to the visit, additional usage of staff PPE, and extensive cleaning of exam room while observing appropriate contact time as indicated for disinfecting solutions.  Subjective:     Patient ID: Erin Barrett , female    DOB: 09-02-1965 , 56 y.o.   MRN: [id number]   Chief Complaint  Patient presents with   Weight Check   vitamin d recheck    HPI  Patient presents today for a weight check. She would like to discuss starting a weight loss medication. She is aware that she has had some weight gain. She is now working at [employer], she is happy to be able to work from home.   Hypertension This is a chronic problem. The current episode started more than 1 year ago. The problem has been gradually improving since onset. The problem is controlled. Pertinent negatives include no blurred vision, chest pain, palpitations or shortness of breath.    Past Medical History:  Diagnosis Date   Anxiety    Bilateral carpal tunnel syndrome 10/21/2019   Bilateral swelling of feet and ankles    Bursitis of hip    Left   Cancer (HCC)    CHF (congestive heart failure) (HCC)    Constipation    Depression    Drug use    Heartburn    Hypertension    Joint pain    Lactose intolerance    Obesity    Osteoarthritis of both knees    Shortness of breath    Sleep apnea    Ulnar neuropathy at elbow, left 10/21/2019   Vitamin D deficiency      Family History  Problem Relation Age of Onset   CVA Mother    Hypertension Mother    Heart disease Mother    Stroke Mother    Anxiety disorder Mother    Obesity Mother    Heart disease Father     Hypertension Father    Sleep apnea Father    Obesity Father      Current Outpatient Medications:    amLODipine (NORVASC) 10 MG tablet, Take 1 tablet (10 mg total) by mouth daily., Disp: 90 tablet, Rfl: 1   DULoxetine (CYMBALTA) 30 MG capsule, TAKE 1 CAPSULE BY MOUTH EVERY DAY, Disp: 90 capsule, Rfl: 1   enalapril (VASOTEC) 20 MG tablet, Take 1 tablet (20 mg total) by mouth daily., Disp: 90 tablet, Rfl: 1   furosemide (LASIX) 40 MG tablet, Take 1 tablet (40 mg total) by mouth daily., Disp: 90 tablet, Rfl: 1   hydrALAZINE (APRESOLINE) 50 MG tablet, TAKE 1 TABLET BY MOUTH 3 TIMES A DAY WITH FOOD, Disp: 270 tablet, Rfl: 2   labetalol (NORMODYNE) 200 MG tablet, Take 2 tablets (400 mg total) by mouth 2 (two) times daily., Disp: 180 tablet, Rfl: 1   Magnesium 500 MG CAPS, Take 1 capsule (500 mg total) by mouth daily., Disp: 90 capsule, Rfl: 1   Multiple Vitamins-Minerals (CENTRUM SILVER PO), Take by mouth., Disp: , Rfl:    potassium chloride SA (KLOR-CON M20) 20 MEQ tablet, TAKE 1 TABLET BY MOUTH EVERY DAY, Disp: 90 tablet, Rfl: 1   Semaglutide-Weight Management (WEGOVY) 0.5  MG/0.5ML SOAJ, Inject 0.5 mg into the skin once a week., Disp: 2 mL, Rfl: 1   Vitamin D, Ergocalciferol, (DRISDOL) 1.25 MG (50000 UNIT) CAPS capsule, Take one tablet once weekly., Disp: 24 capsule, Rfl: 1   No Known Allergies   Review of Systems  Constitutional: Negative.   Eyes:  Negative for blurred vision.  Respiratory: Negative.  Negative for shortness of breath.   Cardiovascular: Negative.  Negative for chest pain and palpitations.  Gastrointestinal: Negative.   Neurological: Negative.   Psychiatric/Behavioral: Negative.      Today's Vitals   02/10/21 1624  BP: 120/70  Pulse: 72  Temp: 98.9 F (37.2 C)  SpO2: 99%  Weight: (!) 315 lb 3.2 oz (143 kg)  Height: 5' 6" (1.676 m)  PainSc: 0-No pain   Body mass index is 50.87 kg/m.  Wt Readings from Last 3 Encounters:  02/10/21 (!) 315 lb 3.2 oz (143 kg)   06/01/20 (!) 315 lb 6.4 oz (143.1 kg)  05/19/20 (!) 313 lb 3.2 oz (142.1 kg)     Objective:  Physical Exam Vitals and nursing note reviewed.  Constitutional:      Appearance: Normal appearance.  HENT:     Head: Normocephalic and atraumatic.     Nose:     Comments: Masked     Mouth/Throat:     Comments: Masked  Eyes:     Extraocular Movements: Extraocular movements intact.  Cardiovascular:     Rate and Rhythm: Normal rate and regular rhythm.     Heart sounds: Normal heart sounds.  Pulmonary:     Effort: Pulmonary effort is normal.     Breath sounds: Normal breath sounds.  Musculoskeletal:     Cervical back: Normal range of motion.  Skin:    General: Skin is warm.  Neurological:     General: No focal deficit present.     Mental Status: She is alert.  Psychiatric:        Mood and Affect: Mood normal.        Behavior: Behavior normal.  c      Assessment And Plan:     1. Class 3 severe obesity due to excess calories with serious comorbidity and body mass index (BMI) of 50.0 to 59.9 in adult [facility]) Comments: BMI 50.  We discussed the use of Wegovy for weight loss. She denies family/personal h/o thyroid cancer. She was given 0.8m samples and advised how to administer the medication. She understands that she will not take her next dose until next week. She is reminded to stop eating when full. She will rto in 4-6 weeks for re-evaluation.  Possible side effects d/w patient. [place] [facility]  - Ambulatory referral to Internal Medicine  2. Essential hypertension Comments: Chronic, well controlled. No med changes. I will check renal function. She will rto in 4-6 months for a full physical examination.  - CMP14+EGFR  3. Vitamin D deficiency disease Comments: I iwll check a vitamin D level and supplement as needed.  - Vitamin D (25 hydroxy)  4. Menopause Comments: She would like referral to GYN for pelvic exam/pap smear.  -  Ambulatory referral to Gynecology  5. Immunization due Comments: She was given Shingrix IM x1.  - Varicella-zoster vaccine IM (Shingrix)  Patient was given opportunity to ask questions. Patient verbalized understanding of the plan and was able to repeat key elements of the plan. All questions were answered to their satisfaction.   I, RBailey Mech  Shawnie Dapper, MD, have reviewed all documentation for this visit. The documentation on 02/10/21 for the exam, diagnosis, procedures, and orders are all accurate and complete.   IF YOU HAVE BEEN REFERRED TO A SPECIALIST, IT MAY TAKE 1-2 WEEKS TO SCHEDULE/PROCESS THE REFERRAL. IF YOU HAVE NOT HEARD FROM US/SPECIALIST IN TWO WEEKS, PLEASE GIVE [place] A CALL AT [phone].   THE PATIENT IS ENCOURAGED TO PRACTICE SOCIAL DISTANCING DUE TO THE COVID-19 PANDEMIC.

## 2021-02-10 NOTE — Patient Instructions (Signed)

## 2021-02-11 ENCOUNTER — Encounter: Payer: Self-pay | Admitting: Internal Medicine

## 2021-02-11 LAB — CMP14+EGFR
ALT: 17 IU/L (ref 0–32)
AST: 19 IU/L (ref 0–40)
Albumin/Globulin Ratio: 1.8 (ref 1.2–2.2)
Albumin: 4.7 g/dL (ref 3.8–4.9)
Alkaline Phosphatase: 77 IU/L (ref 44–121)
BUN/Creatinine Ratio: 17 (ref 9–23)
BUN: 16 mg/dL (ref 6–24)
Bilirubin Total: 0.2 mg/dL (ref 0.0–1.2)
CO2: 26 mmol/L (ref 20–29)
Calcium: 9.6 mg/dL (ref 8.7–10.2)
Chloride: 105 mmol/L (ref 96–106)
Creatinine, Ser: 0.93 mg/dL (ref 0.57–1.00)
Globulin, Total: 2.6 g/dL (ref 1.5–4.5)
Glucose: 103 mg/dL — ABNORMAL HIGH (ref 70–99)
Potassium: 3.9 mmol/L (ref 3.5–5.2)
Sodium: 145 mmol/L — ABNORMAL HIGH (ref 134–144)
Total Protein: 7.3 g/dL (ref 6.0–8.5)
eGFR: 73 mL/min/{1.73_m2} (ref >59)

## 2021-02-11 LAB — VITAMIN D 25 HYDROXY (VIT D DEFICIENCY, FRACTURES): Vit D, 25-Hydroxy: 35.5 ng/mL (ref 30.0–100.0)

## 2021-02-12 ENCOUNTER — Encounter: Payer: Self-pay | Admitting: Internal Medicine

## 2021-02-16 ENCOUNTER — Encounter: Payer: Self-pay | Admitting: Internal Medicine

## 2021-03-04 ENCOUNTER — Encounter: Payer: Self-pay | Admitting: Internal Medicine

## 2021-03-24 ENCOUNTER — Ambulatory Visit: Payer: No Typology Code available for payment source | Admitting: Internal Medicine

## 2021-03-29 ENCOUNTER — Other Ambulatory Visit: Payer: Self-pay

## 2021-03-29 ENCOUNTER — Encounter: Payer: Self-pay | Admitting: Obstetrics & Gynecology

## 2021-03-29 ENCOUNTER — Ambulatory Visit: Payer: No Typology Code available for payment source | Admitting: Obstetrics & Gynecology

## 2021-03-29 ENCOUNTER — Other Ambulatory Visit (HOSPITAL_COMMUNITY)
Admission: RE | Admit: 2021-03-29 | Discharge: 2021-03-29 | Disposition: A | Discharge status: Home / Self Care | Payer: BC Managed Care – PPO | Source: Ambulatory Visit | Attending: Obstetrics & Gynecology | Admitting: Obstetrics & Gynecology

## 2021-03-29 VITALS — BP 120/72 | HR 72 | Resp 16 | Ht 67.25 in | Wt 318.0 lb

## 2021-03-29 DIAGNOSIS — Z01419 Encounter for gynecological examination (general) (routine) without abnormal findings: Secondary | ICD-10-CM

## 2021-03-29 DIAGNOSIS — Z780 Asymptomatic menopausal state: Secondary | ICD-10-CM

## 2021-03-29 NOTE — Progress Notes (Signed)
? ? ?Erin Barrett May 27, 1965 [id number] ? ? ?History:    56 y.o. G2P2L2 ? ?RP:  New patient presenting for annual gyn exam  ? ?HPI: Postmenopause x 10 years, well on no HRT.  No PMB.  Occasional hot flushes.  No pelvic pain.  Last Pap around 2019.  Pap reflex today.  Breasts normal.  MMG Negative 09-10-18, will schedule Mammo now.  COLONOSCOPY: 2021.  BMI 49.44.  On a low Carb diet x 6 weeks.  Increase fitness activities, enjoys dancing.  Health labs with Fam MD. ? ?Past medical history,surgical history, family history and social history were all reviewed and documented in the EPIC chart. ? ?Gynecologic History ?Patient's last menstrual period was 04/29/2011. ? ?Obstetric History ?OB History  ?Gravida Para Term Preterm AB Living  ?'2 2 2     2  '$ ?SAB IAB Ectopic Multiple Live Births  ?           ?  ?# Outcome Date GA Lbr Len/2nd Weight Sex Delivery Anes PTL Lv  ?2 Term           ?1 Term           ? ? ? ?ROS: A ROS was performed and pertinent positives and negatives are included in the history. ? GENERAL: No fevers or chills. HEENT: No change in vision, no earache, sore throat or sinus congestion. NECK: No pain or stiffness. CARDIOVASCULAR: No chest pain or pressure. No palpitations. PULMONARY: No shortness of breath, cough or wheeze. GASTROINTESTINAL: No abdominal pain, nausea, vomiting or diarrhea, melena or bright red blood per rectum. GENITOURINARY: No urinary frequency, urgency, hesitancy or dysuria. MUSCULOSKELETAL: No joint or muscle pain, no back pain, no recent trauma. DERMATOLOGIC: No rash, no itching, no lesions. ENDOCRINE: No polyuria, polydipsia, no heat or cold intolerance. No recent change in weight. HEMATOLOGICAL: No anemia or easy bruising or bleeding. NEUROLOGIC: No headache, seizures, numbness, tingling or weakness. PSYCHIATRIC: No depression, no loss of interest in normal activity or change in sleep pattern.  ?  ? ?Exam: ? ? ?Ht 5' 7.25" (1.708 m)   Wt (!) 318 lb (144.2 kg)   LMP 04/29/2011   BMI  49.44 kg/m?  ? ?Body mass index is 49.44 kg/m?. ? ?General appearance : Well developed well nourished female. No acute distress ?HEENT: Eyes: no retinal hemorrhage or exudates,  Neck supple, trachea midline, no carotid bruits, no thyroidmegaly ?Lungs: Clear to auscultation, no rhonchi or wheezes, or rib retractions  ?Heart: Regular rate and rhythm, no murmurs or gallops ?Breast:Examined in sitting and supine position were symmetrical in appearance, no palpable masses or tenderness,  no skin retraction, no nipple inversion, no nipple discharge, no skin discoloration, no axillary or supraclavicular lymphadenopathy ?Abdomen: no palpable masses or tenderness, no rebound or guarding ?Extremities: no edema or skin discoloration or tenderness ? ?Pelvic: Vulva: Normal ?            Vagina: No gross lesions or discharge ? Cervix: No gross lesions or discharge.  Pap reflex done. ? Uterus  AV, normal size, shape and consistency, non-tender and mobile ? Adnexa  Without masses or tenderness ? Anus: Normal ? ? ?Assessment/Plan:  56 y.o. female for annual exam  ? ?1. Encounter for routine gynecological examination with Papanicolaou smear of cervix ?Postmenopause x 10 years, well on no HRT.  No PMB.  Occasional hot flushes.  No pelvic pain.  Last Pap around 2019.  Pap reflex today.  Breasts normal.  MMG Negative 09-10-18, will schedule Mammo  now.  COLONOSCOPY: 2021.  BMI 49.44.  On a low Carb diet x 6 weeks.  Increase fitness activities, enjoys dancing.  Health labs with Fam MD. ?- Cytology - PAP( [place]) ? ?2. Postmenopause ?Postmenopause x 10 years, well on no HRT.  No PMB.  Occasional hot flushes.  No pelvic pain.  Will try Ashwagandha.  Vit D supplement, Ca++ total 1.5 g/d and weight bearing physical activities. ? ?3. Morbid obesity ([place]) ?Low calorie/carb diet.  Fitness activities on a regular basis. ? ?Other orders ?- VITAMIN D PO; Take by mouth.  ? ?Princess Bruins MD, 10:03 AM 03/29/2021 ? ?  ?

## 2021-03-31 ENCOUNTER — Ambulatory Visit: Payer: No Typology Code available for payment source | Admitting: Internal Medicine

## 2021-03-31 LAB — CYTOLOGY - PAP: Diagnosis: NEGATIVE

## 2021-04-06 ENCOUNTER — Encounter: Payer: Self-pay | Admitting: Nurse Practitioner

## 2021-04-06 ENCOUNTER — Ambulatory Visit: Payer: No Typology Code available for payment source | Admitting: Nurse Practitioner

## 2021-04-06 ENCOUNTER — Other Ambulatory Visit: Payer: Self-pay

## 2021-04-06 VITALS — BP 132/70 | HR 74 | Temp 98.5°F | Ht 67.5 in | Wt 312.0 lb

## 2021-04-06 DIAGNOSIS — Z6841 Body Mass Index (BMI) 40.0 and over, adult: Secondary | ICD-10-CM

## 2021-04-06 DIAGNOSIS — M7062 Trochanteric bursitis, left hip: Secondary | ICD-10-CM | POA: Diagnosis not present

## 2021-04-06 DIAGNOSIS — I110 Hypertensive heart disease with heart failure: Secondary | ICD-10-CM

## 2021-04-06 DIAGNOSIS — I5032 Chronic diastolic (congestive) heart failure: Secondary | ICD-10-CM

## 2021-04-06 MED ORDER — WEGOVY 1 MG/0.5ML ~~LOC~~ SOAJ: 1.0000 mg | SUBCUTANEOUS | 0 refills | Status: DC

## 2021-04-06 NOTE — Progress Notes (Signed)
?Industrial/product designer as a Education administrator for [employer], FNP.,have documented all relevant documentation on the behalf of Minette Brine, FNP,as directed by  Minette Brine, FNP while in the presence of Minette Brine, [place]. ? ?This visit occurred during the SARS-CoV-2 public health emergency.  Safety protocols were in place, including screening questions prior to the visit, additional usage of staff PPE, and extensive cleaning of exam room while observing appropriate contact time as indicated for disinfecting solutions. ? ?Subjective:  ?  ? Patient ID: Erin Barrett , female    DOB: 1965/04/24 , 56 y.o.   MRN: [id number] ? ? ?Chief Complaint  ?Patient presents with  ? Weight Check  ? ? ?HPI ? ?Patient presents today for a weight check. She is currently taking Wegovy 0.'5mg'$ . she is doing well, she does notice she eats less but still has sweet cravings. She has complaints of hip pain due to bursitis and arthritis. She is doing [employer] through her job.  She is sitting during the day. During the breaks she will walk since working outside.  ? ?Wt Readings from Last 3 Encounters: ?04/06/21 : (!) 312 lb (141.5 kg) ?03/29/21 : (!) 318 lb (144.2 kg) ?02/10/21 : (!) 315 lb 3.2 oz (143 kg) ? ? ? ?Hypertension ?This is a chronic problem. The current episode started more than 1 year ago. The problem has been gradually improving since onset. The problem is controlled. Pertinent negatives include no blurred vision, chest pain, palpitations or shortness of breath. There are no associated agents to hypertension. Risk factors for coronary artery disease include obesity and sedentary lifestyle.   ? ?Past Medical History:  ?Diagnosis Date  ? Anxiety   ? Arthritis   ? Bilateral carpal tunnel syndrome 10/21/2019  ? Bilateral swelling of feet and ankles   ? Bursitis   ? left hip  ? Bursitis of hip   ? Left  ? CHF (congestive heart failure) ([place])   ? Constipation   ? Depression   ? Drug use   ? Heartburn   ? Hypertension   ? Joint pain   ?  Lactose intolerance   ? Obesity   ? Osteoarthritis of both knees   ? Shortness of breath   ? Sleep apnea   ? Sleep apnea   ? 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 Vitamin D deficiency   ?  ? ?Family History  ?Problem Relation Age of Onset  ? CVA Mother   ? Hypertension Mother   ? Heart disease Mother   ? Stroke Mother   ? Anxiety disorder Mother   ? Obesity Mother   ? Heart disease Father   ? Hypertension Father   ? Sleep apnea Father   ? Obesity Father   ? ? ? ?Current Outpatient Medications:  ?  amLODipine (NORVASC) 10 MG tablet, Take 1 tablet (10 mg total) by mouth daily., Disp: 90 tablet, Rfl: 1 ?  DULoxetine (CYMBALTA) 30 MG capsule, TAKE 1 CAPSULE BY MOUTH EVERY DAY, Disp: 90 capsule, Rfl: 1 ?  enalapril (VASOTEC) 20 MG tablet, Take 1 tablet (20 mg total) by mouth daily., Disp: 90 tablet, Rfl: 1 ?  furosemide (LASIX) 40 MG tablet, Take 1 tablet (40 mg total) by mouth daily., Disp: 90 tablet, Rfl: 1 ?  hydrALAZINE (APRESOLINE) 50 MG tablet, TAKE 1 TABLET BY MOUTH 3 TIMES A DAY WITH FOOD, Disp: 270 tablet, Rfl: 2 ?  labetalol (NORMODYNE) 200 MG tablet, Take 2 tablets (400 mg total) by mouth 2 (  two) times daily., Disp: 180 tablet, Rfl: 1 ?  Magnesium 500 MG CAPS, Take 1 capsule (500 mg total) by mouth daily., Disp: 90 capsule, Rfl: 1 ?  Multiple Vitamins-Minerals (CENTRUM SILVER PO), Take by mouth., Disp: , Rfl:  ?  potassium chloride SA (KLOR-CON M20) 20 MEQ tablet, TAKE 1 TABLET BY MOUTH EVERY DAY, Disp: 90 tablet, Rfl: 1 ?  Semaglutide-Weight Management (WEGOVY) 1 MG/0.5ML SOAJ, Inject 1 mg into the skin once a week., Disp: 2 mL, Rfl: 0 ?  VITAMIN D PO, Take by mouth., Disp: , Rfl:   ? ?No Known Allergies  ? ?Review of Systems  ?Constitutional: Negative.   ?Eyes:  Negative for blurred vision.  ?Respiratory: Negative.  Negative for shortness of breath.   ?Cardiovascular: Negative.  Negative for chest pain and palpitations.  ?Gastrointestinal: Negative.   ?Musculoskeletal:  Positive for arthralgias.   ?Neurological: Negative.   ?Psychiatric/Behavioral: Negative.     ? ?Today's Vitals  ? 04/06/21 0846  ?BP: 132/70  ?Pulse: 74  ?Temp: 98.5 ?F (36.9 ?C)  ?TempSrc: Oral  ?Weight: (!) 312 lb (141.5 kg)  ?Height: 5' 7.5" (1.715 m)  ? ?Body mass index is 48.14 kg/m?.  ? ?Objective:  ?Physical Exam ?Vitals reviewed.  ?Constitutional:   ?   Appearance: She is well-developed.  ?HENT:  ?   Head: Normocephalic and atraumatic.  ?Eyes:  ?   Pupils: Pupils are equal, round, and reactive to light.  ?Cardiovascular:  ?   Rate and Rhythm: Normal rate and regular rhythm.  ?   Pulses: Normal pulses.  ?   Heart sounds: Normal heart sounds. No murmur heard. ?Pulmonary:  ?   Effort: Pulmonary effort is normal.  ?   Breath sounds: Normal breath sounds.  ?Musculoskeletal:     ?   General: Normal range of motion.  ?Skin: ?   General: Skin is warm and dry.  ?   Capillary Refill: Capillary refill takes less than 2 seconds.  ?Neurological:  ?   General: No focal deficit present.  ?   Mental Status: She is alert and oriented to person, place, and time.  ?   Cranial Nerves: No cranial nerve deficit.  ?Psychiatric:     ?   Mood and Affect: Mood normal.     ?   Behavior: Behavior normal.     ?   Thought Content: Thought content normal.     ?   Judgment: Judgment normal.  ?  ? ?   ?Assessment And Plan:  ?   ?1. Hypertensive heart disease with chronic diastolic congestive heart failure ([place]) ?Comments: Blood pressure is normal, continue current medications.  ? ?2. Trochanteric bursitis of left hip ?Comments: She is doing okay with it right now other than not being able to do more walking.  ? ?3. Class 3 severe obesity due to excess calories with serious comorbidity and body mass index (BMI) of 45.0 to 49.9 in adult [facility]) ?Comments: Weight has been consistent with her Wegovy and has had a 3 lb weight loss in 6 weeks.  ?- Semaglutide-Weight Management (WEGOVY) 1 MG/0.5ML SOAJ; Inject 1 mg into the skin once a week.  Dispense: 2 mL; Refill: 0 ?   ? ? ?Patient was given opportunity to ask questions. Patient verbalized understanding of the plan and was able to repeat key elements of the plan. All questions were answered to their satisfaction.  ?Minette Brine, FNP  ? ?I, Minette Brine, FNP, have reviewed all documentation for this visit.  The documentation on 04/06/21 for the exam, diagnosis, procedures, and orders are all accurate and complete.  ? ? ?IF YOU HAVE BEEN REFERRED TO A SPECIALIST, IT MAY TAKE 1-2 WEEKS TO SCHEDULE/PROCESS THE REFERRAL. IF YOU HAVE NOT HEARD FROM US/SPECIALIST IN TWO WEEKS, PLEASE GIVE [place] A CALL AT [phone] X [phone].  ? ?THE PATIENT IS ENCOURAGED TO PRACTICE SOCIAL DISTANCING DUE TO THE COVID-19 PANDEMIC.   ?

## 2021-04-06 NOTE — Patient Instructions (Signed)
Obesity, Adult ?Obesity is having too much body fat. Being obese means that your weight is more than what is healthy for you.  ?BMI (body mass index) is a number that explains how much body fat you have. If you have a BMI of 30 or more, you are obese. ?Obesity can cause serious health problems, such as: ?Stroke. ?Coronary artery disease (CAD). ?Type 2 diabetes. ?Some types of cancer. ?High blood pressure (hypertension). ?High cholesterol. ?Gallbladder stones. ?Obesity can also contribute to: ?Osteoarthritis. ?Sleep apnea. ?Infertility problems. ?What are the causes? ?Eating meals each day that are high in calories, sugar, and fat. ?Drinking a lot of drinks that have sugar in them. ?Being born with genes that may make you more likely to become obese. ?Having a medical condition that causes obesity. ?Taking certain medicines. ?Sitting a lot (having a sedentary lifestyle). ?Not getting enough sleep. ?What increases the risk? ?Having a family history of obesity. ?Living in an area with limited access to: ?Parks, recreation centers, or sidewalks. ?Healthy food choices, such as grocery stores and farmers' markets. ?What are the signs or symptoms? ?The main sign is having too much body fat. ?How is this treated? ?Treatment for this condition often includes changing your lifestyle. Treatment may include: ?Changing your diet. This may include making a healthy meal plan. ?Exercise. This may include activity that causes your heart to beat faster (aerobic exercise) and strength training. Work with your doctor to design a program that works for you. ?Medicine to help you lose weight. This may be used if you are not able to lose one pound a week after 6 weeks of healthy eating and more exercise. ?Treating conditions that cause the obesity. ?Surgery. Options may include gastric banding and gastric bypass. This may be done if: ?Other treatments have not helped to improve your condition. ?You have a BMI of 40 or higher. ?You have  life-threatening health problems related to obesity. ?Follow these instructions at home: ?Eating and drinking ? ?Follow advice from your doctor about what to eat and drink. Your doctor may tell you to: ?Limit fast food, sweets, and processed snack foods. ?Choose low-fat options. For example, choose low-fat milk instead of whole milk. ?Eat five or more servings of fruits or vegetables each day. ?Eat at home more often. This gives you more control over what you eat. ?Choose healthy foods when you eat out. ?Learn to read food labels. This will help you learn how much food is in one serving. ?Keep low-fat snacks available. ?Avoid drinks that have a lot of sugar in them. These include soda, fruit juice, iced tea with sugar, and flavored milk. ?Drink enough water to keep your pee (urine) pale yellow. ?Do not go on fad diets. ?Physical activity ?Exercise often, as told by your doctor. Most adults should get up to 150 minutes of moderate-intensity exercise every week.Ask your doctor: ?What types of exercise are safe for you. ?How often you should exercise. ?Warm up and stretch before being active. ?Do slow stretching after being active (cool down). ?Rest between times of being active. ?Lifestyle ?Work with your doctor and a food expert (dietitian) to set a weight-loss goal that is best for you. ?Limit your screen time. ?Find ways to reward yourself that do not involve food. ?Do not drink alcohol if: ?Your doctor tells you not to drink. ?You are pregnant, may be pregnant, or are planning to become pregnant. ?If you drink alcohol: ?Limit how much you have to: ?0-1 drink a day for women. ?0-2 drinks   a day for men. ?Know how much alcohol is in your drink. In the [place] one drink equals one 12 oz bottle of beer (355 mL), one 5 oz glass of wine (148 mL), or one 1? oz glass of hard liquor (44 mL). ?General instructions ?Keep a weight-loss journal. This can help you keep track of: ?The food that you eat. ?How much exercise you  get. ?Take over-the-counter and prescription medicines only as told by your doctor. ?Take vitamins and supplements only as told by your doctor. ?Think about joining a support group. ?Pay attention to your mental health as obesity can lead to depression or self esteem issues. ?Keep all follow-up visits. ?Contact a doctor if: ?You cannot meet your weight-loss goal after you have changed your diet and lifestyle for 6 weeks. ?You are having trouble breathing. ?Summary ?Obesity is having too much body fat. ?Being obese means that your weight is more than what is healthy for you. ?Work with your doctor to set a weight-loss goal. ?Get regular exercise as told by your doctor. ?This information is not intended to replace advice given to you by your health care provider. Make sure you discuss any questions you have with your health care provider. ?Document Revised: 08/03/2020 Document Reviewed: 08/03/2020 ?Elsevier Patient Education ? [place]. ? ?

## 2021-04-08 ENCOUNTER — Other Ambulatory Visit: Payer: Self-pay | Admitting: Nurse Practitioner

## 2021-04-08 DIAGNOSIS — E66813 Obesity, class 3: Secondary | ICD-10-CM

## 2021-04-13 ENCOUNTER — Encounter: Payer: Self-pay | Admitting: Internal Medicine

## 2021-04-22 ENCOUNTER — Encounter: Payer: Self-pay | Admitting: Internal Medicine

## 2021-04-22 ENCOUNTER — Other Ambulatory Visit: Payer: Self-pay

## 2021-04-22 MED ORDER — AMLODIPINE BESYLATE 10 MG PO TABS: 10.0000 mg | ORAL_TABLET | Freq: Every day | ORAL | 1 refills | Status: DC

## 2021-04-22 MED ORDER — HYDRALAZINE HCL 50 MG PO TABS: ORAL_TABLET | ORAL | 2 refills | Status: DC

## 2021-04-22 MED ORDER — ENALAPRIL MALEATE 20 MG PO TABS: 20.0000 mg | ORAL_TABLET | Freq: Every day | ORAL | 1 refills | Status: DC

## 2021-04-24 ENCOUNTER — Other Ambulatory Visit: Payer: Self-pay | Admitting: Internal Medicine

## 2021-04-25 ENCOUNTER — Other Ambulatory Visit: Payer: Self-pay

## 2021-04-25 MED ORDER — ENALAPRIL MALEATE 20 MG PO TABS: 20.0000 mg | ORAL_TABLET | Freq: Every day | ORAL | 1 refills | Status: DC

## 2021-04-25 MED ORDER — AMLODIPINE BESYLATE 10 MG PO TABS: 10.0000 mg | ORAL_TABLET | Freq: Every day | ORAL | 1 refills | Status: DC

## 2021-04-25 MED ORDER — HYDRALAZINE HCL 50 MG PO TABS: ORAL_TABLET | ORAL | 2 refills | Status: DC

## 2021-05-18 ENCOUNTER — Other Ambulatory Visit: Payer: Self-pay | Admitting: Internal Medicine

## 2021-05-20 ENCOUNTER — Other Ambulatory Visit: Payer: Self-pay | Admitting: Nurse Practitioner

## 2021-05-21 MED ORDER — WEGOVY 1 MG/0.5ML ~~LOC~~ SOAJ: 1.0000 mg | SUBCUTANEOUS | 0 refills | Status: DC

## 2021-05-24 ENCOUNTER — Ambulatory Visit (INDEPENDENT_AMBULATORY_CARE_PROVIDER_SITE_OTHER): Payer: No Typology Code available for payment source | Admitting: Internal Medicine

## 2021-05-24 ENCOUNTER — Encounter: Payer: Self-pay | Admitting: Internal Medicine

## 2021-05-24 VITALS — BP 124/88 | HR 66 | Temp 98.3°F | Ht 67.5 in | Wt 313.4 lb

## 2021-05-24 DIAGNOSIS — I5032 Chronic diastolic (congestive) heart failure: Secondary | ICD-10-CM | POA: Diagnosis not present

## 2021-05-24 DIAGNOSIS — Z0000 Encounter for general adult medical examination without abnormal findings: Secondary | ICD-10-CM

## 2021-05-24 DIAGNOSIS — Z23 Encounter for immunization: Secondary | ICD-10-CM | POA: Diagnosis not present

## 2021-05-24 DIAGNOSIS — R0989 Other specified symptoms and signs involving the circulatory and respiratory systems: Secondary | ICD-10-CM | POA: Diagnosis not present

## 2021-05-24 DIAGNOSIS — Z6841 Body Mass Index (BMI) 40.0 and over, adult: Secondary | ICD-10-CM

## 2021-05-24 DIAGNOSIS — I110 Hypertensive heart disease with heart failure: Secondary | ICD-10-CM | POA: Diagnosis not present

## 2021-05-24 LAB — POCT URINALYSIS DIPSTICK
Bilirubin, UA: NEGATIVE
Blood, UA: NEGATIVE
Glucose, UA: NEGATIVE
Ketones, UA: NEGATIVE
Leukocytes, UA: NEGATIVE
Nitrite, UA: NEGATIVE
Protein, UA: NEGATIVE
Spec Grav, UA: 1.02 (ref 1.010–1.025)
Urobilinogen, UA: 0.2 E.U./dL
pH, UA: 6 (ref 5.0–8.0)

## 2021-05-24 NOTE — Patient Instructions (Signed)

## 2021-05-24 NOTE — Progress Notes (Signed)
Erin Barrett,acting as a Education administrator for Erin Greenland, MD.,have documented all relevant documentation on the behalf of Erin Greenland, MD,as directed by  Erin Greenland, MD while in the presence of Erin Greenland, MD.  This visit occurred during the SARS-CoV-2 public health emergency.  Safety protocols were in place, including screening questions prior to the visit, additional usage of staff PPE, and extensive cleaning of exam room while observing appropriate contact time as indicated for disinfecting solutions.  Subjective:     Patient ID: Erin Barrett , female    DOB: [birth year] , 56 y.o.   MRN: [id number]   Chief Complaint  Patient presents with   Annual Exam   Hypertension    HPI  She is here today for a full physical examination. She is followed by [facility] for her pelvic exam.  She reports compliance with meds. She denies headaches, chest pain and shortness of breath.   Hypertension This is a chronic problem. The current episode started more than 1 year ago. The problem has been gradually improving since onset. The problem is controlled. Pertinent negatives include no blurred vision. Risk factors for coronary artery disease include obesity and post-menopausal state. The current treatment provides moderate improvement.    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   Vitamin D deficiency      Family History  Problem Relation Age of Onset   CVA Mother    Hypertension Mother    Heart disease Mother    Stroke  Mother    Anxiety disorder Mother    Obesity Mother    Heart disease Father    Hypertension Father    Sleep apnea Father    Obesity Father      Current Outpatient Medications:    amLODipine (NORVASC) 10 MG tablet, Take 1 tablet (10 mg total) by mouth daily., Disp: 90 tablet, Rfl: 1   DULoxetine (CYMBALTA) 30 MG capsule, TAKE 1 CAPSULE BY MOUTH EVERY DAY, Disp: 90 capsule, Rfl: 1   enalapril (VASOTEC) 20 MG tablet, Take 1 tablet (20 mg total) by mouth daily., Disp: 90 tablet, Rfl: 1   furosemide (LASIX) 40 MG tablet, Take 1 tablet (40 mg total) by mouth daily., Disp: 90 tablet, Rfl: 1   hydrALAZINE (APRESOLINE) 50 MG tablet, TAKE 1 TABLET BY MOUTH 3 TIMES A DAY WITH FOOD, Disp: 270 tablet, Rfl: 2   labetalol (NORMODYNE) 200 MG tablet, Take 2 tablets (400 mg total) by mouth 2 (two) times daily., Disp: 180 tablet, Rfl: 1   Magnesium 500 MG CAPS, Take 1 capsule (500 mg total) by mouth daily., Disp: 90 capsule, Rfl: 1   Multiple Vitamins-Minerals (CENTRUM SILVER PO), Take by mouth., Disp: , Rfl:    potassium chloride SA (KLOR-CON M20) 20 MEQ tablet, TAKE 1 TABLET BY MOUTH EVERY DAY, Disp: 90  tablet, Rfl: 1   Semaglutide-Weight Management (WEGOVY) 1 MG/0.5ML SOAJ, Inject 1 mg into the skin once a week., Disp: 2 mL, Rfl: 0   VITAMIN D PO, Take by mouth., Disp: , Rfl:    No Known Allergies   The patient states she uses post menopausal status for birth control. Last LMP was Patient's last menstrual period was 04/29/2011.. Negative for Dysmenorrhea. Negative for: breast discharge, breast lump(s), breast pain and breast self exam. Associated symptoms include abnormal vaginal bleeding. Pertinent negatives include abnormal bleeding (hematology), anxiety, decreased libido, depression, difficulty falling sleep, dyspareunia, history of infertility, nocturia, sexual dysfunction, sleep disturbances, urinary incontinence, urinary urgency, vaginal discharge and vaginal itching. Diet regular.The patient states  her exercise level is  moderate  . The patient's tobacco use is:  Social History   Tobacco Use  Smoking Status Former   Packs/day: 0.33   Years: 34.00   Pack years: 11.22   Types: Cigarettes   Start date: 1986   Quit date: 01/1998   Years since quitting: 23.4  Smokeless Tobacco Never  . She has been exposed to passive smoke. The patient's alcohol use is:  Social History   Substance and Sexual Activity  Alcohol Use Yes   Comment: OCCASIONALLY   Review of Systems  Constitutional: Negative.   HENT: Negative.    Eyes: Negative.  Negative for blurred vision.  Respiratory: Negative.    Cardiovascular: Negative.   Gastrointestinal: Negative.   Endocrine: Negative.   Genitourinary: Negative.   Musculoskeletal: Negative.   Skin: Negative.   Allergic/Immunologic: Negative.   Neurological: Negative.   Hematological: Negative.   Psychiatric/Behavioral: Negative.      Today's Vitals   05/24/21 0953  BP: 124/88  Pulse: 66  Temp: 98.3 F (36.8 C)  Weight: (!) 313 lb 6.4 oz (142.2 kg)  Height: 5' 7.5" (1.715 m)  PainSc: 4    Body mass index is 48.36 kg/m.  Wt Readings from Last 3 Encounters:  05/24/21 (!) 313 lb 6.4 oz (142.2 kg)  04/06/21 (!) 312 lb (141.5 kg)  03/29/21 (!) 318 lb (144.2 kg)     Objective:  Physical Exam Constitutional:      General: She is not in acute distress.    Appearance: Normal appearance. She is well-developed. She is obese.  HENT:     Head: Normocephalic and atraumatic.     Right Ear: Hearing, tympanic membrane, ear canal and external ear normal. There is no impacted cerumen.     Left Ear: Hearing, tympanic membrane, ear canal and external ear normal. There is no impacted cerumen.     Nose:     Comments: Deferred - masked    Mouth/Throat:     Comments: Deferred - masked Eyes:     General: Lids are normal.     Extraocular Movements: Extraocular movements intact.     Conjunctiva/sclera: Conjunctivae normal.     Pupils: Pupils are equal,  round, and reactive to light.     Funduscopic exam:    Right eye: No papilledema.        Left eye: No papilledema.  Neck:     Thyroid: No thyroid mass.     Vascular: No carotid bruit.  Cardiovascular:     Rate and Rhythm: Normal rate and regular rhythm.     Pulses:          Dorsalis pedis pulses are 1+ on the right side and 1+ on the left side.     Heart sounds: Normal heart sounds.  No murmur heard. Pulmonary:     Effort: Pulmonary effort is normal.     Breath sounds: Normal breath sounds.  Chest:     Chest wall: No mass.  Breasts:    Tanner Score is 5.     Right: Normal. No mass or tenderness.     Left: Normal. No mass or tenderness.  Abdominal:     General: Abdomen is flat. Bowel sounds are normal. There is no distension.     Palpations: Abdomen is soft.     Tenderness: There is no abdominal tenderness.  Genitourinary:    Rectum: Guaiac result negative.  Musculoskeletal:        General: No swelling. Normal range of motion.     Cervical back: Full passive range of motion without pain, normal range of motion and neck supple.     Right lower leg: No edema.     Left lower leg: No edema.  Lymphadenopathy:     Upper Body:     Right upper body: No supraclavicular, axillary or pectoral adenopathy.     Left upper body: No supraclavicular, axillary or pectoral adenopathy.  Skin:    General: Skin is warm and dry.     Capillary Refill: Capillary refill takes less than 2 seconds.  Neurological:     General: No focal deficit present.     Mental Status: She is alert and oriented to person, place, and time.     Cranial Nerves: No cranial nerve deficit.     Sensory: No sensory deficit.  Psychiatric:        Mood and Affect: Mood normal.        Behavior: Behavior normal.        Thought Content: Thought content normal.        Judgment: Judgment normal.     Assessment And Plan:     1. Encounter for general adult medical examination w/o abnormal findings Comments: A full exam was  performed. Importance of monthly self breast exams was discussed with the patient. Labs from [facility] reviewed in [place]. PATIENT IS ADVISED TO GET 30-45 MINUTES REGULAR EXERCISE NO LESS THAN FOUR TO FIVE DAYS PER WEEK - BOTH WEIGHTBEARING EXERCISES AND AEROBIC ARE RECOMMENDED.  PATIENT IS ADVISED TO FOLLOW A HEALTHY DIET WITH AT LEAST SIX FRUITS/VEGGIES PER DAY, DECREASE INTAKE OF RED MEAT, AND TO INCREASE FISH INTAKE TO TWO DAYS PER WEEK.  MEATS/FISH SHOULD NOT BE FRIED, BAKED OR BROILED IS PREFERABLE.  IT IS ALSO IMPORTANT TO CUT BACK ON YOUR SUGAR INTAKE. PLEASE AVOID ANYTHING WITH ADDED SUGAR, CORN SYRUP OR OTHER SWEETENERS. IF YOU MUST USE A SWEETENER, YOU CAN TRY STEVIA. IT IS ALSO IMPORTANT TO AVOID ARTIFICIALLY SWEETENERS AND DIET BEVERAGES. LASTLY, I SUGGEST WEARING SPF 50 SUNSCREEN ON EXPOSED PARTS AND ESPECIALLY WHEN IN THE DIRECT SUNLIGHT FOR AN EXTENDED PERIOD OF TIME.  PLEASE AVOID FAST FOOD RESTAURANTS AND INCREASE YOUR WATER INTAKE.  2. Hypertensive heart disease with chronic diastolic congestive heart failure (HCC) Comments: Chronic, fair control. Diastolic elevation noted. No med changes today. EKG performed, NSR w/ nonspecific T abnormality. She is encouraged to follow a low sodium diet. he will f/u in 6 months.  - POCT Urinalysis Dipstick (81002) - Microalbumin / Creatinine Urine Ratio - EKG 12-Lead - ECHOCARDIOGRAM COMPLETE; Future  3. Decreased dorsalis pedis pulse Comments: I will refer her for ABI. She ise ncouraged to   4. Class 3 severe obesity due to excess calories with serious comorbidity and body mass index (  BMI) of 45.0 to 49.9 in adult [facility]) Comments: BMI 48. She will c/w Wegovy, I will send '1mg'$  rx, f/u in 8 weeks.  She is also followed by [facility] program.   5. Immunization due - Varicella-zoster vaccine IM (Shingrix)  Patient was given opportunity to ask questions. Patient verbalized understanding of the plan and was able to repeat key elements of  the plan. All questions were answered to their satisfaction.   I, Erin Greenland, MD, have reviewed all documentation for this visit. The documentation on 05/24/21 for the exam, diagnosis, procedures, and orders are all accurate and complete.   THE PATIENT IS ENCOURAGED TO PRACTICE SOCIAL DISTANCING DUE TO THE COVID-19 PANDEMIC.

## 2021-06-15 ENCOUNTER — Other Ambulatory Visit: Payer: Self-pay

## 2021-06-15 ENCOUNTER — Encounter: Payer: Self-pay | Admitting: Internal Medicine

## 2021-06-15 DIAGNOSIS — I5032 Chronic diastolic (congestive) heart failure: Secondary | ICD-10-CM

## 2021-06-15 MED ORDER — FUROSEMIDE 40 MG PO TABS: 40.0000 mg | ORAL_TABLET | Freq: Every day | ORAL | 1 refills | Status: DC

## 2021-06-15 MED ORDER — LABETALOL HCL 200 MG PO TABS: 400.0000 mg | ORAL_TABLET | Freq: Two times a day (BID) | ORAL | 1 refills | Status: DC

## 2021-07-05 ENCOUNTER — Telehealth (HOSPITAL_COMMUNITY): Payer: Self-pay | Admitting: Internal Medicine

## 2021-08-10 ENCOUNTER — Encounter: Payer: Self-pay | Admitting: Internal Medicine

## 2021-08-17 ENCOUNTER — Encounter (INDEPENDENT_AMBULATORY_CARE_PROVIDER_SITE_OTHER): Payer: Self-pay

## 2021-08-24 ENCOUNTER — Ambulatory Visit (HOSPITAL_COMMUNITY): Payer: No Typology Code available for payment source | Attending: Cardiology

## 2021-08-24 DIAGNOSIS — I110 Hypertensive heart disease with heart failure: Secondary | ICD-10-CM | POA: Insufficient documentation

## 2021-08-24 DIAGNOSIS — I5032 Chronic diastolic (congestive) heart failure: Secondary | ICD-10-CM | POA: Insufficient documentation

## 2021-08-24 LAB — ECHOCARDIOGRAM COMPLETE
Area-P 1/2: 3.36 cm2
S' Lateral: 3.7 cm

## 2021-12-14 ENCOUNTER — Other Ambulatory Visit: Payer: Self-pay | Admitting: Internal Medicine

## 2022-02-20 ENCOUNTER — Other Ambulatory Visit: Payer: Self-pay | Admitting: Internal Medicine

## 2022-03-02 ENCOUNTER — Ambulatory Visit (INDEPENDENT_AMBULATORY_CARE_PROVIDER_SITE_OTHER): Payer: No Typology Code available for payment source | Admitting: Internal Medicine

## 2022-03-02 ENCOUNTER — Encounter: Payer: Self-pay | Admitting: Internal Medicine

## 2022-03-02 ENCOUNTER — Other Ambulatory Visit: Payer: Self-pay

## 2022-03-02 VITALS — BP 130/92 | HR 89 | Temp 98.2°F | Ht 67.0 in | Wt 256.2 lb

## 2022-03-02 DIAGNOSIS — M7062 Trochanteric bursitis, left hip: Secondary | ICD-10-CM

## 2022-03-02 DIAGNOSIS — Z6841 Body Mass Index (BMI) 40.0 and over, adult: Secondary | ICD-10-CM | POA: Diagnosis not present

## 2022-03-02 DIAGNOSIS — R0989 Other specified symptoms and signs involving the circulatory and respiratory systems: Secondary | ICD-10-CM

## 2022-03-02 DIAGNOSIS — I110 Hypertensive heart disease with heart failure: Secondary | ICD-10-CM

## 2022-03-02 DIAGNOSIS — I5032 Chronic diastolic (congestive) heart failure: Secondary | ICD-10-CM

## 2022-03-02 DIAGNOSIS — Z1231 Encounter for screening mammogram for malignant neoplasm of breast: Secondary | ICD-10-CM

## 2022-03-02 DIAGNOSIS — E559 Vitamin D deficiency, unspecified: Secondary | ICD-10-CM

## 2022-03-02 MED ORDER — AMLODIPINE BESYLATE 10 MG PO TABS: 10.0000 mg | ORAL_TABLET | Freq: Every day | ORAL | 1 refills | Status: DC

## 2022-03-02 MED ORDER — ENALAPRIL MALEATE 20 MG PO TABS: 20.0000 mg | ORAL_TABLET | Freq: Every day | ORAL | 1 refills | Status: DC

## 2022-03-02 MED ORDER — FUROSEMIDE 40 MG PO TABS: 40.0000 mg | ORAL_TABLET | Freq: Every day | ORAL | 1 refills | Status: DC

## 2022-03-02 NOTE — Patient Instructions (Signed)
Hypertension, Adult ?Hypertension is another name for high blood pressure. High blood pressure forces your heart to work harder to pump blood. This can cause problems over time. ?There are two numbers in a blood pressure reading. There is a top number (systolic) over a bottom number (diastolic). It is best to have a blood pressure that is below 120/80. ?What are the causes? ?The cause of this condition is not known. Some other conditions can lead to high blood pressure. ?What increases the risk? ?Some lifestyle factors can make you more likely to develop high blood pressure: ?Smoking. ?Not getting enough exercise or physical activity. ?Being overweight. ?Having too much fat, sugar, calories, or salt (sodium) in your diet. ?Drinking too much alcohol. ?Other risk factors include: ?Having any of these conditions: ?Heart disease. ?Diabetes. ?High cholesterol. ?Kidney disease. ?Obstructive sleep apnea. ?Having a family history of high blood pressure and high cholesterol. ?Age. The risk increases with age. ?Stress. ?What are the signs or symptoms? ?High blood pressure may not cause symptoms. Very high blood pressure (hypertensive crisis) may cause: ?Headache. ?Fast or uneven heartbeats (palpitations). ?Shortness of breath. ?Nosebleed. ?Vomiting or feeling like you may vomit (nauseous). ?Changes in how you see. ?Very bad chest pain. ?Feeling dizzy. ?Seizures. ?How is this treated? ?This condition is treated by making healthy lifestyle changes, such as: ?Eating healthy foods. ?Exercising more. ?Drinking less alcohol. ?Your doctor may prescribe medicine if lifestyle changes do not help enough and if: ?Your top number is above 130. ?Your bottom number is above 80. ?Your personal target blood pressure may vary. ?Follow these instructions at home: ?Eating and drinking ? ?If told, follow the DASH eating plan. To follow this plan: ?Fill one half of your plate at each meal with fruits and vegetables. ?Fill one fourth of your plate  at each meal with whole grains. Whole grains include whole-wheat pasta, brown rice, and whole-grain bread. ?Eat or drink low-fat dairy products, such as skim milk or low-fat yogurt. ?Fill one fourth of your plate at each meal with low-fat (lean) proteins. Low-fat proteins include fish, chicken without skin, eggs, beans, and tofu. ?Avoid fatty meat, cured and processed meat, or chicken with skin. ?Avoid pre-made or processed food. ?Limit the amount of salt in your diet to less than 1,500 mg each day. ?Do not drink alcohol if: ?Your doctor tells you not to drink. ?You are pregnant, may be pregnant, or are planning to become pregnant. ?If you drink alcohol: ?Limit how much you have to: ?0-1 drink a day for women. ?0-2 drinks a day for men. ?Know how much alcohol is in your drink. In the [place] one drink equals one 12 oz bottle of beer (355 mL), one 5 oz glass of wine (148 mL), or one 1? oz glass of hard liquor (44 mL). ?Lifestyle ? ?Work with your doctor to stay at a healthy weight or to lose weight. Ask your doctor what the best weight is for you. ?Get at least 30 minutes of exercise that causes your heart to beat faster (aerobic exercise) most days of the week. This may include walking, swimming, or biking. ?Get at least 30 minutes of exercise that strengthens your muscles (resistance exercise) at least 3 days a week. This may include lifting weights or doing Pilates. ?Do not smoke or use any products that contain nicotine or tobacco. If you need help quitting, ask your doctor. ?Check your blood pressure at home as told by your doctor. ?Keep all follow-up visits. ?Medicines ?Take over-the-counter and prescription medicines   only as told by your doctor. Follow directions carefully. ?Do not skip doses of blood pressure medicine. The medicine does not work as well if you skip doses. Skipping doses also puts you at risk for problems. ?Ask your doctor about side effects or reactions to medicines that you should watch  for. ?Contact a doctor if: ?You think you are having a reaction to the medicine you are taking. ?You have headaches that keep coming back. ?You feel dizzy. ?You have swelling in your ankles. ?You have trouble with your vision. ?Get help right away if: ?You get a very bad headache. ?You start to feel mixed up (confused). ?You feel weak or numb. ?You feel faint. ?You have very bad pain in your: ?Chest. ?Belly (abdomen). ?You vomit more than once. ?You have trouble breathing. ?These symptoms may be an emergency. Get help right away. Call 911. ?Do not wait to see if the symptoms will go away. ?Do not drive yourself to the hospital. ?Summary ?Hypertension is another name for high blood pressure. ?High blood pressure forces your heart to work harder to pump blood. ?For most people, a normal blood pressure is less than 120/80. ?Making healthy choices can help lower blood pressure. If your blood pressure does not get lower with healthy choices, you may need to take medicine. ?This information is not intended to replace advice given to you by your health care provider. Make sure you discuss any questions you have with your health care provider. ?Document Revised: 10/14/2020 Document Reviewed: 10/14/2020 ?Elsevier Patient Education ? 2023 [employer] ? ?

## 2022-03-02 NOTE — Progress Notes (Signed)
I,Erin Barrett,acting as a scribe for Erin Greenland, MD.,have documented all relevant documentation on the behalf of Erin Greenland, MD,as directed by  Erin Greenland, MD while in the presence of Erin Greenland, MD.    Subjective:     Patient ID: Erin Barrett , female    DOB: 08/06/1965 , 57 y.o.   MRN: [id number]   Chief Complaint  Patient presents with   Hypertension    HPI  Pt presents today for bpc. She states her blood pressure has been high for a few weeks. She checks bp at home 3-4x a week, using a wrist bp monitor.  She reports compliance with meds, but needs refills. Denies headache, dizziness, chest pain, SOB.    Hypertension This is a chronic problem. The current episode started more than 1 year ago. The problem has been gradually improving since onset. The problem is controlled. Pertinent negatives include no blurred vision, chest pain, palpitations or shortness of breath. There are no associated agents to hypertension. Risk factors for coronary artery disease include obesity and sedentary lifestyle.     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   Vitamin D deficiency      Family History  Problem Relation Age of Onset   CVA Mother    Hypertension Mother    Heart disease Mother    Stroke Mother    Anxiety disorder Mother    Obesity Mother    Heart disease Father    Hypertension Father    Sleep apnea Father    Obesity Father      Current Outpatient Medications:    hydrALAZINE (APRESOLINE) 50  MG tablet, TAKE 1 TABLET BY MOUTH 3 TIMES A DAY WITH FOOD, Disp: 270 tablet, Rfl: 2   labetalol (NORMODYNE) 200 MG tablet, Take 2 tablets (400 mg total) by mouth 2 (two) times daily., Disp: 180 tablet, Rfl: 1   Magnesium 500 MG CAPS, Take 1 capsule (500 mg total) by mouth daily., Disp: 90 capsule, Rfl: 1   Multiple Vitamins-Minerals (CENTRUM SILVER PO), Take by mouth., Disp: , Rfl:    potassium chloride SA (KLOR-CON M20) 20 MEQ tablet, TAKE 1 TABLET BY MOUTH EVERY DAY, Disp: 90 tablet, Rfl: 1   VITAMIN D PO, Take by mouth., Disp: , Rfl:    WEGOVY 2.4 MG/0.75ML SOAJ, INJECT 2.'4MG'$  INTO THE SKIN EVERY 7 DAYS, Disp: , Rfl:    amLODipine (NORVASC) 10 MG tablet, Take 1 tablet (10 mg total) by mouth daily., Disp: 90 tablet, Rfl: 1   enalapril (VASOTEC) 20 MG tablet, Take 1 tablet (20 mg total) by mouth daily., Disp: 90 tablet, Rfl: 1   furosemide (LASIX) 40 MG tablet, Take 1 tablet (40 mg total) by mouth daily., Disp: 90 tablet, Rfl: 1   No Known Allergies   Review of Systems  Constitutional: Negative.   Eyes:  Negative for blurred vision.  Respiratory: Negative.  Negative for shortness of breath.   Cardiovascular: Negative.  Negative for chest pain and palpitations.  Gastrointestinal: Negative.   Neurological: Negative.   Psychiatric/Behavioral: Negative.       Today's Vitals   03/02/22 1528 03/02/22 1552  BP: (!) 136/92 (!) 130/92  Pulse: 89   Temp: 98.2 F (36.8 C)   SpO2: 98%   Weight: 256 lb 3.2 oz (116.2 kg)   Height: '5\' 7"'$  (1.702 m)    Body mass index is 40.13 kg/m.  Wt Readings from Last 3 Encounters:  03/02/22 256 lb 3.2 oz (116.2 kg)  05/24/21 (!) 313 lb 6.4 oz (142.2 kg)  04/06/21 (!) 312 lb (141.5 kg)    Objective:  Physical Exam Vitals and nursing note reviewed.  Constitutional:      Appearance: Normal appearance. She is obese.  HENT:     Head: Normocephalic and atraumatic.     Nose:     Comments: Masked     Mouth/Throat:     Comments: Masked  Eyes:      Extraocular Movements: Extraocular movements intact.  Cardiovascular:     Rate and Rhythm: Normal rate and regular rhythm.     Heart sounds: Normal heart sounds.  Pulmonary:     Effort: Pulmonary effort is normal.     Breath sounds: Normal breath sounds.  Musculoskeletal:     Cervical back: Normal range of motion.  Skin:    General: Skin is warm.  Neurological:     General: No focal deficit present.     Mental Status: She is alert.  Psychiatric:        Mood and Affect: Mood normal.        Behavior: Behavior normal.      Assessment And Plan:     1. Hypertensive heart disease with chronic diastolic congestive heart failure (HCC) Comments: Chronic, fair control. No med changes. She will c/w amlodipine '10mg'$ , enalapril '20mg'$  daily, furosemide '40mg'$  qd, hydralazine '50mg'$  tid and labetolol '200mg'$  -2 bid.  She will f/u in 4-6 months for her next physical examination.  - CMP14+EGFR - Insulin, random(561)  2. Breast cancer screening by mammogram - MM 3D SCREEN BREAST BILATERAL; Future  3. Class 3 severe obesity due to excess calories with serious comorbidity and body mass index (BMI) of 40.0 to 44.9 in adult [facility]) She was congratulated on her 60+ pound weight loss since May 2023. She is encouraged to now strive for BMI less than 30 to decrease cardiac risk. Advised to aim for at least 150 minutes of exercise per week.   Patient was given opportunity to ask questions. Patient verbalized understanding of the plan and was able to repeat key elements of the plan. All questions were answered to their satisfaction.   I, Erin Greenland, MD, have reviewed all documentation for this visit. The documentation on 03/02/22 for the exam, diagnosis, procedures, and orders are all accurate and complete.   IF YOU HAVE BEEN REFERRED TO A SPECIALIST, IT MAY TAKE 1-2 WEEKS TO SCHEDULE/PROCESS THE REFERRAL. IF YOU HAVE NOT HEARD FROM US/SPECIALIST IN TWO WEEKS, PLEASE GIVE [place] A CALL AT [phone].   THE  PATIENT IS ENCOURAGED TO PRACTICE SOCIAL DISTANCING DUE TO THE COVID-19 PANDEMIC.

## 2022-03-03 LAB — CMP14+EGFR
ALT: 15 IU/L (ref 0–32)
AST: 18 IU/L (ref 0–40)
Albumin/Globulin Ratio: 2 (ref 1.2–2.2)
Albumin: 4.5 g/dL (ref 3.8–4.9)
Alkaline Phosphatase: 59 IU/L (ref 44–121)
BUN/Creatinine Ratio: 12 (ref 9–23)
BUN: 13 mg/dL (ref 6–24)
Bilirubin Total: 0.4 mg/dL (ref 0.0–1.2)
CO2: 24 mmol/L (ref 20–29)
Calcium: 9.8 mg/dL (ref 8.7–10.2)
Chloride: 103 mmol/L (ref 96–106)
Creatinine, Ser: 1.08 mg/dL — ABNORMAL HIGH (ref 0.57–1.00)
Globulin, Total: 2.3 g/dL (ref 1.5–4.5)
Glucose: 80 mg/dL (ref 70–99)
Potassium: 3.9 mmol/L (ref 3.5–5.2)
Sodium: 142 mmol/L (ref 134–144)
Total Protein: 6.8 g/dL (ref 6.0–8.5)
eGFR: 61 mL/min/{1.73_m2} (ref >59)

## 2022-03-03 LAB — INSULIN, RANDOM: INSULIN: 30.3 u[IU]/mL — ABNORMAL HIGH (ref 2.6–24.9)

## 2022-03-05 DIAGNOSIS — I110 Hypertensive heart disease with heart failure: Secondary | ICD-10-CM | POA: Insufficient documentation

## 2022-03-05 DIAGNOSIS — M7062 Trochanteric bursitis, left hip: Secondary | ICD-10-CM | POA: Insufficient documentation

## 2022-03-05 DIAGNOSIS — R0989 Other specified symptoms and signs involving the circulatory and respiratory systems: Secondary | ICD-10-CM | POA: Insufficient documentation

## 2022-03-29 ENCOUNTER — Other Ambulatory Visit: Payer: Self-pay | Admitting: Internal Medicine

## 2022-04-05 ENCOUNTER — Ambulatory Visit (INDEPENDENT_AMBULATORY_CARE_PROVIDER_SITE_OTHER): Payer: No Typology Code available for payment source | Admitting: Obstetrics & Gynecology

## 2022-04-05 ENCOUNTER — Encounter: Payer: Self-pay | Admitting: Obstetrics & Gynecology

## 2022-04-05 VITALS — BP 114/70 | HR 84 | Ht 66.75 in | Wt 255.0 lb

## 2022-04-05 DIAGNOSIS — Z6841 Body Mass Index (BMI) 40.0 and over, adult: Secondary | ICD-10-CM

## 2022-04-05 DIAGNOSIS — Z780 Asymptomatic menopausal state: Secondary | ICD-10-CM

## 2022-04-05 DIAGNOSIS — Z01419 Encounter for gynecological examination (general) (routine) without abnormal findings: Secondary | ICD-10-CM

## 2022-04-05 NOTE — Progress Notes (Signed)
Erin Barrett April 02, 1965 [id number]   History:    57 y.o.  G2P2L2   RP:  Established patient presenting for annual gyn exam    HPI: Postmenopause x 10 years, well on no HRT.  No PMB.  Occasional hot flushes.  No pelvic pain.  Abstinent.  Last Pap Neg 03/2021. No h/o abnormal Pap, repeat at 3 years.  Breasts normal.  MMG scheduled 04/19/22.  COLONOSCOPY: 2021.  BMI improved  to 40.24 on Wegovy.  On a low Carb diet.  Increase fitness activities, enjoys dancing.  Health labs with Fam MD.   Past medical history,surgical history, family history and social history were all reviewed and documented in the EPIC chart.  Gynecologic History Patient's last menstrual period was 04/29/2011.  Obstetric History OB History  Gravida Para Term Preterm AB Living  2 2 2     2   SAB IAB Ectopic Multiple Live Births               # Outcome Date GA Lbr Len/2nd Weight Sex Delivery Anes PTL Lv  2 Term           1 Term              ROS: A ROS was performed and pertinent positives and negatives are included in the history. GENERAL: No fevers or chills. HEENT: No change in vision, no earache, sore throat or sinus congestion. NECK: No pain or stiffness. CARDIOVASCULAR: No chest pain or pressure. No palpitations. PULMONARY: No shortness of breath, cough or wheeze. GASTROINTESTINAL: No abdominal pain, nausea, vomiting or diarrhea, melena or bright red blood per rectum. GENITOURINARY: No urinary frequency, urgency, hesitancy or dysuria. MUSCULOSKELETAL: No joint or muscle pain, no back pain, no recent trauma. DERMATOLOGIC: No rash, no itching, no lesions. ENDOCRINE: No polyuria, polydipsia, no heat or cold intolerance. No recent change in weight. HEMATOLOGICAL: No anemia or easy bruising or bleeding. NEUROLOGIC: No headache, seizures, numbness, tingling or weakness. PSYCHIATRIC: No depression, no loss of interest in normal activity or change in sleep pattern.     Exam:   BP 114/70   Pulse 84   Ht 5' 6.75" (1.695  m)   Wt 255 lb (115.7 kg)   LMP 04/29/2011 Comment: not sexually active  SpO2 99%   BMI 40.24 kg/m   Body mass index is 40.24 kg/m.  General appearance : Well developed well nourished female. No acute distress HEENT: Eyes: no retinal hemorrhage or exudates,  Neck supple, trachea midline, no carotid bruits, no thyroidmegaly Lungs: Clear to auscultation, no rhonchi or wheezes, or rib retractions  Heart: Regular rate and rhythm, no murmurs or gallops Breast:Examined in sitting and supine position were symmetrical in appearance, no palpable masses or tenderness,  no skin retraction, no nipple inversion, no nipple discharge, no skin discoloration, no axillary or supraclavicular lymphadenopathy Abdomen: no palpable masses or tenderness, no rebound or guarding Extremities: no edema or skin discoloration or tenderness  Pelvic: Vulva: Normal             Vagina: No gross lesions or discharge  Cervix: No gross lesions or discharge  Uterus  AV, normal size, shape and consistency, non-tender and mobile  Adnexa  Without masses or tenderness  Anus: Normal   Assessment/Plan:  57 y.o. female for annual exam   1. Well female exam with routine gynecological exam Postmenopause x 10 years, well on no HRT.  No PMB.  Occasional hot flushes.  No pelvic pain.  Abstinent.  Last Pap Neg  03/2021. No h/o abnormal Pap, repeat at 3 years.  Breasts normal.  MMG scheduled 04/19/22.  COLONOSCOPY: 2021.  BMI improved  to 40.24 on Wegovy.  On a low Carb diet.  Increase fitness activities, enjoys dancing.  Health labs with Fam MD.  2. Postmenopause Postmenopause x 10 years, well on no HRT.  No PMB.  Occasional hot flushes.  No pelvic pain.  Abstinent.   3. Class 3 severe obesity due to excess calories with serious comorbidity and body mass index (BMI) of 40.0 to 44.9 in adult (HCC) BMI improved  to 40.24 on Wegovy through [facility].  On a low Carb diet.  Increase fitness activities, enjoys dancing.    Other orders -  buPROPion (WELLBUTRIN XL) 300 MG 24 hr tablet; Take 1 tablet by mouth daily.   Princess Bruins MD, 8:13 AM

## 2022-04-19 ENCOUNTER — Ambulatory Visit
Admission: RE | Admit: 2022-04-19 | Discharge: 2022-04-19 | Disposition: A | Discharge status: Home / Self Care | Payer: No Typology Code available for payment source | Source: Ambulatory Visit | Attending: Internal Medicine | Admitting: Internal Medicine

## 2022-04-19 DIAGNOSIS — Z1231 Encounter for screening mammogram for malignant neoplasm of breast: Secondary | ICD-10-CM

## 2022-06-12 ENCOUNTER — Ambulatory Visit: Payer: No Typology Code available for payment source | Admitting: Internal Medicine

## 2022-06-12 ENCOUNTER — Encounter: Payer: Self-pay | Admitting: Internal Medicine

## 2022-06-12 ENCOUNTER — Other Ambulatory Visit: Payer: Self-pay | Admitting: Internal Medicine

## 2022-06-12 VITALS — BP 126/84 | Temp 98.6°F | Ht 66.0 in | Wt 243.0 lb

## 2022-06-12 DIAGNOSIS — I10 Essential (primary) hypertension: Secondary | ICD-10-CM | POA: Diagnosis not present

## 2022-06-12 DIAGNOSIS — E559 Vitamin D deficiency, unspecified: Secondary | ICD-10-CM | POA: Diagnosis not present

## 2022-06-12 DIAGNOSIS — Z0000 Encounter for general adult medical examination without abnormal findings: Secondary | ICD-10-CM

## 2022-06-12 DIAGNOSIS — Z6839 Body mass index (BMI) 39.0-39.9, adult: Secondary | ICD-10-CM

## 2022-06-12 DIAGNOSIS — R202 Paresthesia of skin: Secondary | ICD-10-CM

## 2022-06-12 NOTE — Patient Instructions (Addendum)
Dr. Jannifer Franklin - mental health provider The 10-year ASCVD risk score (Arnett DK, et al., 2019) is: 5.3%   Values used to calculate the score:     Age: 57 years     Sex: Female     Is Non-Hispanic African American: Yes     Diabetic: No     Tobacco smoker: No     Systolic Blood Pressure: 126 mmHg     Is BP treated: Yes     HDL Cholesterol: 40 MG/DL     Total Cholesterol: 164 MG/DL Coronary Calcium Scan A coronary calcium scan is an imaging test used to look for deposits of plaque in the inner lining of the blood vessels of the heart (coronary arteries). Plaque is made up of calcium, protein, and fatty substances. These deposits of plaque can partly clog and narrow the coronary arteries without producing any symptoms or warning signs. This puts a person at risk for a heart attack. A coronary calcium scan is performed using a computed tomography (CT) scanner machine without using a dye (contrast). This test is recommended for people who are at moderate risk for heart disease. The test can find plaque deposits before symptoms develop. Tell a health care provider about: Any allergies you have. All medicines you are taking, including vitamins, herbs, eye drops, creams, and over-the-counter medicines. Any problems you or family members have had with anesthetic medicines. Any bleeding problems you have. Any surgeries you have had. Any medical conditions you have. Whether you are pregnant or may be pregnant. What are the risks? Generally, this is a safe procedure. However, problems may occur, including: Harm to a pregnant woman and her unborn baby. This test involves the use of radiation. Radiation exposure can be dangerous to a pregnant woman and her unborn baby. If you are pregnant or think you may be pregnant, you should not have this procedure done. A slight increase in the risk of cancer. This is because of the radiation involved in the test. The amount of radiation from one test is similar to  the amount of radiation you are naturally exposed to over one year. What happens before the procedure? Ask your health care provider for any specific instructions on how to prepare for this procedure. You may be asked to avoid products that contain caffeine, tobacco, or nicotine for 4 hours before the procedure. What happens during the procedure?  You will undress and remove any jewelry from your neck or chest. You may need to remove hearing aides and dentures. Women may need to remove their bras. You will put on a hospital gown. Sticky electrodes will be placed on your chest. The electrodes will be connected to an electrocardiogram (ECG) machine to record a tracing of the electrical activity of your heart. You will lie down on your back on a curved bed that is attached to the CT scanner. You may be given medicine to slow down your heart rate so that clear pictures can be created. You will be moved into the CT scanner, and the CT scanner will take pictures of your heart. During this time, you will be asked to lie still and hold your breath for 10-20 seconds at a time while each picture of your heart is being taken. The procedure may vary among health care providers and hospitals. What can I expect after the procedure? You can return to your normal activities. It is up to you to get the results of your procedure. Ask your health care provider, or  the department that is doing the procedure, when your results will be ready. Summary A coronary calcium scan is an imaging test used to look for deposits of plaque in the inner lining of the blood vessels of the heart. Plaque is made up of calcium, protein, and fatty substances. A coronary calcium scan is performed using a CT scanner machine without contrast. Generally, this is a safe procedure. Tell your health care provider if you are pregnant or may be pregnant. Ask your health care provider for any specific instructions on how to prepare for this  procedure. You can return to your normal activities after the scan is done. This information is not intended to replace advice given to you by your health care provider. Make sure you discuss any questions you have with your health care provider. Document Revised: 12/05/2020 Document Reviewed: 12/05/2020 Elsevier Patient Education  2024 [employer]   Health Maintenance, Female Adopting a healthy lifestyle and getting preventive care are important in promoting health and wellness. Ask your health care provider about: The right schedule for you to have regular tests and exams. Things you can do on your own to prevent diseases and keep yourself healthy. What should I know about diet, weight, and exercise? Eat a healthy diet  Eat a diet that includes plenty of vegetables, fruits, low-fat dairy products, and lean protein. Do not eat a lot of foods that are high in solid fats, added sugars, or sodium. Maintain a healthy weight Body mass index (BMI) is used to identify weight problems. It estimates body fat based on height and weight. Your health care provider can help determine your BMI and help you achieve or maintain a healthy weight. Get regular exercise Get regular exercise. This is one of the most important things you can do for your health. Most adults should: Exercise for at least 150 minutes each week. The exercise should increase your heart rate and make you sweat (moderate-intensity exercise). Do strengthening exercises at least twice a week. This is in addition to the moderate-intensity exercise. Spend less time sitting. Even light physical activity can be beneficial. Watch cholesterol and blood lipids Have your blood tested for lipids and cholesterol at 56 years of age, then have this test every 5 years. Have your cholesterol levels checked more often if: Your lipid or cholesterol levels are high. You are older than 56 years of age. You are at high risk for heart disease. What  should I know about cancer screening? Depending on your health history and family history, you may need to have cancer screening at various ages. This may include screening for: Breast cancer. Cervical cancer. Colorectal cancer. Skin cancer. Lung cancer. What should I know about heart disease, diabetes, and high blood pressure? Blood pressure and heart disease High blood pressure causes heart disease and increases the risk of stroke. This is more likely to develop in people who have high blood pressure readings or are overweight. Have your blood pressure checked: Every 3-5 years if you are 60-60 years of age. Every year if you are 59 years old or older. Diabetes Have regular diabetes screenings. This checks your fasting blood sugar level. Have the screening done: Once every three years after age 35 if you are at a normal weight and have a low risk for diabetes. More often and at a younger age if you are overweight or have a high risk for diabetes. What should I know about preventing infection? Hepatitis B If you have a higher risk  for hepatitis B, you should be screened for this virus. Talk with your health care provider to find out if you are at risk for hepatitis B infection. Hepatitis C Testing is recommended for: Everyone born from [birth year] through 1964. Anyone with known risk factors for hepatitis C. Sexually transmitted infections (STIs) Get screened for STIs, including gonorrhea and chlamydia, if: You are sexually active and are younger than 56 years of age. You are older than 56 years of age and your health care provider tells you that you are at risk for this type of infection. Your sexual activity has changed since you were last screened, and you are at increased risk for chlamydia or gonorrhea. Ask your health care provider if you are at risk. Ask your health care provider about whether you are at high risk for HIV. Your health care provider may recommend a prescription medicine  to help prevent HIV infection. If you choose to take medicine to prevent HIV, you should first get tested for HIV. You should then be tested every 3 months for as long as you are taking the medicine. Pregnancy If you are about to stop having your period (premenopausal) and you may become pregnant, seek counseling before you get pregnant. Take 400 to 800 micrograms (mcg) of folic acid every day if you become pregnant. Ask for birth control (contraception) if you want to prevent pregnancy. Osteoporosis and menopause Osteoporosis is a disease in which the bones lose minerals and strength with aging. This can result in bone fractures. If you are 35 years old or older, or if you are at risk for osteoporosis and fractures, ask your health care provider if you should: Be screened for bone loss. Take a calcium or vitamin D supplement to lower your risk of fractures. Be given hormone replacement therapy (HRT) to treat symptoms of menopause. Follow these instructions at home: Alcohol use Do not drink alcohol if: Your health care provider tells you not to drink. You are pregnant, may be pregnant, or are planning to become pregnant. If you drink alcohol: Limit how much you have to: 0-1 drink a day. Know how much alcohol is in your drink. In the [place] one drink equals one 12 oz bottle of beer (355 mL), one 5 oz glass of wine (148 mL), or one 1 oz glass of hard liquor (44 mL). Lifestyle Do not use any products that contain nicotine or tobacco. These products include cigarettes, chewing tobacco, and vaping devices, such as e-cigarettes. If you need help quitting, ask your health care provider. Do not use street drugs. Do not share needles. Ask your health care provider for help if you need support or information about quitting drugs. General instructions Schedule regular health, dental, and eye exams. Stay current with your vaccines. Tell your health care provider if: You often feel depressed. You  have ever been abused or do not feel safe at home. Summary Adopting a healthy lifestyle and getting preventive care are important in promoting health and wellness. Follow your health care provider's instructions about healthy diet, exercising, and getting tested or screened for diseases. Follow your health care provider's instructions on monitoring your cholesterol and blood pressure. This information is not intended to replace advice given to you by your health care provider. Make sure you discuss any questions you have with your health care provider. Document Revised: 05/17/2020 Document Reviewed: 05/17/2020 Elsevier Patient Education  2024 [employer].

## 2022-06-12 NOTE — Progress Notes (Signed)
I,Erin Barrett,acting as a scribe for Erin Aliment, MD.,have documented all relevant documentation on the behalf of Erin Aliment, MD,as directed by  Erin Aliment, MD while in the presence of Erin Aliment, MD.   Subjective:     Patient ID: Erin Barrett , female    DOB: 09-19-1965 , 57 y.o.   MRN: [id number]   Chief Complaint  Patient presents with   Annual Exam   Hypertension    HPI  She is here today for a full physical examination. She is followed by [facility] for her pelvic exam.  She reports compliance with meds. She denies headaches, chest pain and shortness of breath.   Hypertension This is a chronic problem. The current episode started more than 1 year ago. The problem has been gradually improving since onset. The problem is controlled. Pertinent negatives include no blurred vision. Risk factors for coronary artery disease include obesity and post-menopausal state. The current treatment provides moderate improvement.     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   Vitamin D deficiency      Family History  Problem Relation Age of Onset   CVA Mother    Hypertension Mother    Heart disease Mother    Stroke Mother    Anxiety disorder Mother    Obesity Mother    Heart disease Father    Hypertension Father    Sleep apnea Father    Obesity Father      Current Outpatient Medications:    amLODipine (NORVASC) 10 MG tablet, Take 1 tablet (10 mg total) by mouth daily., Disp: 90 tablet, Rfl:  1   buPROPion (WELLBUTRIN XL) 300 MG 24 hr tablet, Take 1 tablet by mouth daily., Disp: , Rfl:    enalapril (VASOTEC) 20 MG tablet, Take 1 tablet (20 mg total) by mouth daily., Disp: 90 tablet, Rfl: 1   furosemide (LASIX) 40 MG tablet, Take 1 tablet (40 mg total) by mouth daily., Disp: 90 tablet, Rfl: 1   labetalol (NORMODYNE) 200 MG tablet, TAKE 2 TABLETS BY MOUTH 2 TIMES DAILY., Disp: 360 tablet, Rfl: 0   Magnesium 500 MG CAPS, Take 1 capsule (500 mg total) by mouth daily., Disp: 90 capsule, Rfl: 1   Multiple Vitamins-Minerals (CENTRUM SILVER PO), Take by mouth., Disp: , Rfl:    VITAMIN D PO, Take by mouth., Disp: , Rfl:    WEGOVY 2.4 MG/0.75ML SOAJ, INJECT 2.4MG  INTO THE SKIN EVERY 7 DAYS, Disp: , Rfl:    hydrALAZINE (APRESOLINE) 50 MG tablet, TAKE 1 TABLET BY MOUTH THREE TIMES A DAY WITH FOOD, Disp: 270 tablet, Rfl: 2   potassium chloride SA (KLOR-CON M20) 20 MEQ tablet, TAKE 1 TABLET BY MOUTH EVERY DAY (Patient not taking: Reported on 06/12/2022), Disp: 90 tablet, Rfl: 1   No Known Allergies  The patient states she uses none for birth control. Last LMP was Patient's last menstrual period was 04/29/2011.. Negative for Dysmenorrhea. Negative for: breast discharge, breast lump(s), breast pain and breast self exam. Associated symptoms include abnormal vaginal bleeding. Pertinent negatives include abnormal bleeding (hematology), anxiety, decreased libido, depression, difficulty falling sleep, dyspareunia, history of infertility, nocturia, sexual dysfunction, sleep disturbances, urinary incontinence, urinary urgency, vaginal discharge and vaginal itching. Diet regular.The patient states her exercise level is  moderate.  . The patient's tobacco use is:  Social History   Tobacco Use  Smoking Status Former   Packs/day: 0.33   Years: 34.00   Additional pack years: 0.00   Total pack years: 11.22   Types: Cigarettes   Start date: 89   Quit date: 01/1998   Years since quitting: 24.4  Smokeless  Tobacco Never  . She has been exposed to passive smoke. The patient's alcohol use is:  Social History   Substance and Sexual Activity  Alcohol Use Yes   Comment: OCCASIONALLY    Review of Systems  Constitutional: Negative.   HENT: Negative.    Eyes: Negative.  Negative for blurred vision.  Respiratory: Negative.    Cardiovascular: Negative.   Gastrointestinal: Negative.   Endocrine: Negative.   Genitourinary: Negative.   Musculoskeletal: Negative.   Skin: Negative.   Allergic/Immunologic: Negative.   Neurological:  Positive for numbness.  Hematological: Negative.   Psychiatric/Behavioral: Negative.       Today's Vitals   06/12/22 1505  BP: 126/84  Temp: 98.6 F (37 C)  SpO2: 98%  Weight: 243 lb (110.2 kg)  Height: 5\' 6"  (1.676 m)   Body mass index is 39.22 kg/m.  Wt Readings from Last 3 Encounters:  06/12/22 243 lb (110.2 kg)  04/05/22 255 lb (115.7 kg)  03/02/22 256 lb 3.2 oz (116.2 kg)    Objective:  Physical Exam Vitals and nursing note reviewed.  Constitutional:      Appearance: Normal appearance.  HENT:     Head: Normocephalic and atraumatic.  Eyes:     Extraocular Movements: Extraocular movements intact.  Cardiovascular:     Rate and Rhythm: Normal rate and regular rhythm.     Heart sounds: Normal heart sounds.  Pulmonary:     Effort: Pulmonary effort is normal.     Breath sounds: Normal breath sounds.  Musculoskeletal:     Cervical back: Normal range of motion.  Skin:    General: Skin is warm.  Neurological:     General: No focal deficit present.     Mental Status: She is alert.  Psychiatric:        Mood and Affect: Mood normal.        Behavior: Behavior normal.     Assessment And Plan:     1. Encounter for general adult medical examination w/o abnormal findings Comments: A full exam was performed. Importance of monthly self breast exams was discussed with the patient.  PATIENT IS ADVISED TO GET 30-45 MINUTES REGULAR EXERCISE NO LESS THAN  FOUR TO FIVE DAYS PER WEEK - BOTH WEIGHTBEARING EXERCISES AND AEROBIC ARE RECOMMENDED.  PATIENT IS ADVISED TO FOLLOW A HEALTHY DIET WITH AT LEAST SIX FRUITS/VEGGIES PER DAY, DECREASE INTAKE OF RED MEAT, AND TO INCREASE FISH INTAKE TO TWO DAYS PER WEEK.  MEATS/FISH SHOULD NOT BE FRIED, BAKED OR BROILED IS PREFERABLE.  IT IS ALSO IMPORTANT TO CUT BACK ON YOUR SUGAR INTAKE. PLEASE AVOID ANYTHING WITH ADDED SUGAR, CORN SYRUP OR OTHER SWEETENERS. IF YOU MUST USE  A SWEETENER, YOU CAN TRY STEVIA. IT IS ALSO IMPORTANT TO AVOID ARTIFICIALLY SWEETENERS AND DIET BEVERAGES. LASTLY, I SUGGEST WEARING SPF 50 SUNSCREEN ON EXPOSED PARTS AND ESPECIALLY WHEN IN THE DIRECT SUNLIGHT FOR AN EXTENDED PERIOD OF TIME.  PLEASE AVOID FAST FOOD RESTAURANTS AND INCREASE YOUR WATER INTAKE. - CBC - CMP14+EGFR - Lipid panel - TSH  2. Essential hypertension, benign Comments: Chronic, fair control. She will c/w amlodipine 10mg , enalapril 40mg , furosemide 20, hydralazine 50mg  tid and labetalol 200mg  twice daily.  EKG performed, NSR, nonspecific T abnormality. She will follow up in six months. She is encouraged to follow a low sodium diet.  - POCT Urinalysis Dipstick (81002) - Microalbumin / Creatinine Urine Ratio - EKG 12-Lead  3. Paresthesia of lower extremity - Vitamin B12  4. Vitamin D deficiency disease Comments: Chronic, I will check a vitamin D level and supplement as needed. - Vitamin D (25 hydroxy)  5. Class 2 severe obesity due to excess calories with serious comorbidity and body mass index (BMI) of 39.0 to 39.9 in adult [facility]) Comments: She is encouraged to continue with her regular exercise regimen, she was congratulated on her weight loss thus far.    Return for 1 year physical, 6 month bp. Patient was given opportunity to ask questions. Patient verbalized understanding of the plan and was able to repeat key elements of the plan. All questions were answered to their satisfaction.   I, Erin Aliment, MD, have  reviewed all documentation for this visit. The documentation on 06/12/22 for the exam, diagnosis, procedures, and orders are all accurate and complete.   THE PATIENT IS ENCOURAGED TO PRACTICE SOCIAL DISTANCING DUE TO THE COVID-19 PANDEMIC.

## 2022-06-13 LAB — LIPID PANEL
Chol/HDL Ratio: 3.4 ratio (ref 0.0–4.4)
Cholesterol, Total: 166 mg/dL (ref 100–199)
HDL: 49 mg/dL (ref >39)
LDL Chol Calc (NIH): 97 mg/dL (ref 0–99)
Triglycerides: 108 mg/dL (ref 0–149)
VLDL Cholesterol Cal: 20 mg/dL (ref 5–40)

## 2022-06-13 LAB — CBC
Hematocrit: 39.6 % (ref 34.0–46.6)
Hemoglobin: 13.6 g/dL (ref 11.1–15.9)
MCH: 31.1 pg (ref 26.6–33.0)
MCHC: 34.3 g/dL (ref 31.5–35.7)
MCV: 90 fL (ref 79–97)
Platelets: 271 10*3/uL (ref 150–450)
RBC: 4.38 x10E6/uL (ref 3.77–5.28)
RDW: 13 % (ref 11.7–15.4)
WBC: 5.2 10*3/uL (ref 3.4–10.8)

## 2022-06-13 LAB — CMP14+EGFR
ALT: 17 IU/L (ref 0–32)
AST: 23 IU/L (ref 0–40)
Albumin/Globulin Ratio: 1.9 (ref 1.2–2.2)
Albumin: 4.6 g/dL (ref 3.8–4.9)
Alkaline Phosphatase: 58 IU/L (ref 44–121)
BUN/Creatinine Ratio: 15 (ref 9–23)
BUN: 15 mg/dL (ref 6–24)
Bilirubin Total: 0.4 mg/dL (ref 0.0–1.2)
CO2: 24 mmol/L (ref 20–29)
Calcium: 9.9 mg/dL (ref 8.7–10.2)
Chloride: 104 mmol/L (ref 96–106)
Creatinine, Ser: 0.99 mg/dL (ref 0.57–1.00)
Globulin, Total: 2.4 g/dL (ref 1.5–4.5)
Glucose: 85 mg/dL (ref 70–99)
Potassium: 4 mmol/L (ref 3.5–5.2)
Sodium: 141 mmol/L (ref 134–144)
Total Protein: 7 g/dL (ref 6.0–8.5)
eGFR: 67 mL/min/{1.73_m2} (ref >59)

## 2022-06-13 LAB — MICROALBUMIN / CREATININE URINE RATIO
Creatinine, Urine: 164.1 mg/dL
Microalb/Creat Ratio: 4 mg/g creat (ref 0–29)
Microalbumin, Urine: 6.2 ug/mL

## 2022-06-13 LAB — VITAMIN D 25 HYDROXY (VIT D DEFICIENCY, FRACTURES): Vit D, 25-Hydroxy: 59 ng/mL (ref 30.0–100.0)

## 2022-06-13 LAB — TSH: TSH: 0.633 u[IU]/mL (ref 0.450–4.500)

## 2022-06-13 LAB — VITAMIN B12: Vitamin B-12: 514 pg/mL (ref 232–1245)

## 2022-06-25 ENCOUNTER — Other Ambulatory Visit: Payer: Self-pay | Admitting: Internal Medicine

## 2022-09-29 ENCOUNTER — Other Ambulatory Visit: Payer: Self-pay | Admitting: Internal Medicine

## 2022-12-11 NOTE — Progress Notes (Unsigned)
I,Ossiel Marchio T Deloria Lair, CMA,acting as a Neurosurgeon for Gwynneth Aliment, MD.,have documented all relevant documentation on the behalf of Gwynneth Aliment, MD,as directed by  Gwynneth Aliment, MD while in the presence of Gwynneth Aliment, MD.  Subjective:  Patient ID: Erin Barrett , female    DOB: 1965/04/04 , 57 y.o.   MRN: [id number]  No chief complaint on file.   HPI  Pt presents today for bpc. She reports compliance with meds, but needs refills. Denies headache, dizziness, chest pain, SOB.    Hypertension This is a chronic problem. The current episode started more than 1 year ago. The problem has been gradually improving since onset. The problem is controlled. Pertinent negatives include no blurred vision, chest pain, palpitations or shortness of breath. There are no associated agents to hypertension. Risk factors for coronary artery disease include obesity and sedentary lifestyle.     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   Vitamin D deficiency      Family History  Problem Relation Age of Onset   CVA Mother    Hypertension Mother    Heart disease Mother    Stroke Mother    Anxiety disorder Mother    Obesity Mother    Heart disease Father    Hypertension Father    Sleep apnea Father    Obesity Father      Current Outpatient Medications:    amLODipine (NORVASC) 10 MG tablet, TAKE 1 TABLET BY MOUTH EVERY DAY, Disp: 90 tablet, Rfl: 1   buPROPion (WELLBUTRIN XL) 300 MG 24 hr tablet, Take 1 tablet by mouth daily., Disp: , Rfl:     enalapril (VASOTEC) 20 MG tablet, TAKE 1 TABLET BY MOUTH EVERY DAY, Disp: 90 tablet, Rfl: 1   furosemide (LASIX) 40 MG tablet, Take 1 tablet (40 mg total) by mouth daily., Disp: 90 tablet, Rfl: 1   hydrALAZINE (APRESOLINE) 50 MG tablet, TAKE 1 TABLET BY MOUTH THREE TIMES A DAY WITH FOOD, Disp: 270 tablet, Rfl: 2   labetalol (NORMODYNE) 200 MG tablet, TAKE 2 TABLETS BY MOUTH TWICE A DAY, Disp: 360 tablet, Rfl: 0   Magnesium 500 MG CAPS, Take 1 capsule (500 mg total) by mouth daily., Disp: 90 capsule, Rfl: 1   Multiple Vitamins-Minerals (CENTRUM SILVER PO), Take by mouth., Disp: , Rfl:    potassium chloride SA (KLOR-CON M20) 20 MEQ tablet, TAKE 1 TABLET BY MOUTH EVERY DAY (Patient not taking: Reported on 06/12/2022), Disp: 90 tablet, Rfl: 1   VITAMIN D PO, Take by mouth., Disp: , Rfl:    WEGOVY 2.4 MG/0.75ML SOAJ, INJECT 2.4MG  INTO THE SKIN EVERY 7 DAYS, Disp: , Rfl:    No Known Allergies   Review of Systems  Constitutional: Negative.   Eyes:  Negative for blurred vision.  Respiratory: Negative.  Negative for shortness of  breath.   Cardiovascular: Negative.  Negative for chest pain and palpitations.  Neurological: Negative.   Psychiatric/Behavioral: Negative.       There were no vitals filed for this visit. There is no height or weight on file to calculate BMI.  Wt Readings from Last 3 Encounters:  06/12/22 243 lb (110.2 kg)  04/05/22 255 lb (115.7 kg)  03/02/22 256 lb 3.2 oz (116.2 kg)     Objective:  Physical Exam      Assessment And Plan:  Essential hypertension, benign  Vitamin D deficiency disease     No follow-ups on file.  Patient was given opportunity to ask questions. Patient verbalized understanding of the plan and was able to repeat key elements of the plan. All questions were answered to their satisfaction.  Gwynneth Aliment, MD  I, Gwynneth Aliment, MD, have reviewed all documentation for this visit. The documentation on 12/11/22 for the exam, diagnosis,  procedures, and orders are all accurate and complete.   IF YOU HAVE BEEN REFERRED TO A SPECIALIST, IT MAY TAKE 1-2 WEEKS TO SCHEDULE/PROCESS THE REFERRAL. IF YOU HAVE NOT HEARD FROM US/SPECIALIST IN TWO WEEKS, PLEASE GIVE [place] A CALL AT [phone].   THE PATIENT IS ENCOURAGED TO PRACTICE SOCIAL DISTANCING DUE TO THE COVID-19 PANDEMIC.

## 2022-12-11 NOTE — Patient Instructions (Incomplete)
Hypertension, Adult Hypertension is another name for high blood pressure. High blood pressure forces your heart to work harder to pump blood. This can cause problems over time. There are two numbers in a blood pressure reading. There is a top number (systolic) over a bottom number (diastolic). It is best to have a blood pressure that is below 120/80. What are the causes? The cause of this condition is not known. Some other conditions can lead to high blood pressure. What increases the risk? Some lifestyle factors can make you more likely to develop high blood pressure: Smoking. Not getting enough exercise or physical activity. Being overweight. Having too much fat, sugar, calories, or salt (sodium) in your diet. Drinking too much alcohol. Other risk factors include: Having any of these conditions: Heart disease. Diabetes. High cholesterol. Kidney disease. Obstructive sleep apnea. Having a family history of high blood pressure and high cholesterol. Age. The risk increases with age. Stress. What are the signs or symptoms? High blood pressure may not cause symptoms. Very high blood pressure (hypertensive crisis) may cause: Headache. Fast or uneven heartbeats (palpitations). Shortness of breath. Nosebleed. Vomiting or feeling like you may vomit (nauseous). Changes in how you see. Very bad chest pain. Feeling dizzy. Seizures. How is this treated? This condition is treated by making healthy lifestyle changes, such as: Eating healthy foods. Exercising more. Drinking less alcohol. Your doctor may prescribe medicine if lifestyle changes do not help enough and if: Your top number is above 130. Your bottom number is above 80. Your personal target blood pressure may vary. Follow these instructions at home: Eating and drinking  If told, follow the DASH eating plan. To follow this plan: Fill one half of your plate at each meal with fruits and vegetables. Fill one fourth of your plate  at each meal with whole grains. Whole grains include whole-wheat pasta, brown rice, and whole-grain bread. Eat or drink low-fat dairy products, such as skim milk or low-fat yogurt. Fill one fourth of your plate at each meal with low-fat (lean) proteins. Low-fat proteins include fish, chicken without skin, eggs, beans, and tofu. Avoid fatty meat, cured and processed meat, or chicken with skin. Avoid pre-made or processed food. Limit the amount of salt in your diet to less than 1,500 mg each day. Do not drink alcohol if: Your doctor tells you not to drink. You are pregnant, may be pregnant, or are planning to become pregnant. If you drink alcohol: Limit how much you have to: 0-1 drink a day for women. 0-2 drinks a day for men. Know how much alcohol is in your drink. In the [place] one drink equals one 12 oz bottle of beer (355 mL), one 5 oz glass of wine (148 mL), or one 1 oz glass of hard liquor (44 mL). Lifestyle  Work with your doctor to stay at a healthy weight or to lose weight. Ask your doctor what the best weight is for you. Get at least 30 minutes of exercise that causes your heart to beat faster (aerobic exercise) most days of the week. This may include walking, swimming, or biking. Get at least 30 minutes of exercise that strengthens your muscles (resistance exercise) at least 3 days a week. This may include lifting weights or doing Pilates. Do not smoke or use any products that contain nicotine or tobacco. If you need help quitting, ask your doctor. Check your blood pressure at home as told by your doctor. Keep all follow-up visits. Medicines Take over-the-counter and prescription medicines   only as told by your doctor. Follow directions carefully. Do not skip doses of blood pressure medicine. The medicine does not work as well if you skip doses. Skipping doses also puts you at risk for problems. Ask your doctor about side effects or reactions to medicines that you should watch  for. Contact a doctor if: You think you are having a reaction to the medicine you are taking. You have headaches that keep coming back. You feel dizzy. You have swelling in your ankles. You have trouble with your vision. Get help right away if: You get a very bad headache. You start to feel mixed up (confused). You feel weak or numb. You feel faint. You have very bad pain in your: Chest. Belly (abdomen). You vomit more than once. You have trouble breathing. These symptoms may be an emergency. Get help right away. Call 911. Do not wait to see if the symptoms will go away. Do not drive yourself to the hospital. Summary Hypertension is another name for high blood pressure. High blood pressure forces your heart to work harder to pump blood. For most people, a normal blood pressure is less than 120/80. Making healthy choices can help lower blood pressure. If your blood pressure does not get lower with healthy choices, you may need to take medicine. This information is not intended to replace advice given to you by your health care provider. Make sure you discuss any questions you have with your health care provider. Document Revised: 10/14/2020 Document Reviewed: 10/14/2020 Elsevier Patient Education  2024 [employer]  

## 2022-12-12 ENCOUNTER — Ambulatory Visit: Payer: No Typology Code available for payment source | Admitting: Internal Medicine

## 2022-12-12 DIAGNOSIS — E559 Vitamin D deficiency, unspecified: Secondary | ICD-10-CM

## 2022-12-12 DIAGNOSIS — I10 Essential (primary) hypertension: Secondary | ICD-10-CM

## 2022-12-29 ENCOUNTER — Other Ambulatory Visit: Payer: Self-pay | Admitting: Internal Medicine

## 2022-12-29 DIAGNOSIS — I5032 Chronic diastolic (congestive) heart failure: Secondary | ICD-10-CM

## 2023-03-10 ENCOUNTER — Other Ambulatory Visit: Payer: Self-pay | Admitting: Internal Medicine

## 2023-04-04 ENCOUNTER — Other Ambulatory Visit: Payer: Self-pay | Admitting: Internal Medicine

## 2023-05-21 ENCOUNTER — Other Ambulatory Visit: Payer: Self-pay | Admitting: Internal Medicine

## 2023-06-25 ENCOUNTER — Ambulatory Visit: Payer: Self-pay | Admitting: Internal Medicine

## 2023-06-25 ENCOUNTER — Ambulatory Visit (INDEPENDENT_AMBULATORY_CARE_PROVIDER_SITE_OTHER): Payer: No Typology Code available for payment source | Admitting: Internal Medicine

## 2023-06-25 ENCOUNTER — Encounter: Payer: Self-pay | Admitting: Internal Medicine

## 2023-06-25 VITALS — BP 110/80 | HR 82 | Temp 98.4°F | Ht 66.0 in | Wt 242.8 lb

## 2023-06-25 DIAGNOSIS — E559 Vitamin D deficiency, unspecified: Secondary | ICD-10-CM

## 2023-06-25 DIAGNOSIS — G4733 Obstructive sleep apnea (adult) (pediatric): Secondary | ICD-10-CM

## 2023-06-25 DIAGNOSIS — Z0000 Encounter for general adult medical examination without abnormal findings: Secondary | ICD-10-CM | POA: Diagnosis not present

## 2023-06-25 DIAGNOSIS — R202 Paresthesia of skin: Secondary | ICD-10-CM

## 2023-06-25 DIAGNOSIS — I10 Essential (primary) hypertension: Secondary | ICD-10-CM | POA: Diagnosis not present

## 2023-06-25 DIAGNOSIS — E66812 Obesity, class 2: Secondary | ICD-10-CM

## 2023-06-25 DIAGNOSIS — Z6839 Body mass index (BMI) 39.0-39.9, adult: Secondary | ICD-10-CM

## 2023-06-25 DIAGNOSIS — Z636 Dependent relative needing care at home: Secondary | ICD-10-CM

## 2023-06-25 LAB — POCT URINALYSIS DIP (CLINITEK)
Bilirubin, UA: NEGATIVE
Blood, UA: NEGATIVE
Glucose, UA: NEGATIVE mg/dL
Ketones, POC UA: NEGATIVE mg/dL
Leukocytes, UA: NEGATIVE
Nitrite, UA: NEGATIVE
POC PROTEIN,UA: NEGATIVE
Spec Grav, UA: 1.015 (ref 1.010–1.025)
Urobilinogen, UA: 0.2 U/dL
pH, UA: 7 (ref 5.0–8.0)

## 2023-06-25 MED ORDER — FUROSEMIDE 40 MG PO TABS: 40.0000 mg | ORAL_TABLET | Freq: Every day | ORAL | 0 refills | Status: DC

## 2023-06-25 NOTE — Patient Instructions (Addendum)
 Magnesium  glycinate L-theanine   Health Maintenance, Female Adopting a healthy lifestyle and getting preventive care are important in promoting health and wellness. Ask your health care provider about: The right schedule for you to have regular tests and exams. Things you can do on your own to prevent diseases and keep yourself healthy. What should I know about diet, weight, and exercise? Eat a healthy diet  Eat a diet that includes plenty of vegetables, fruits, low-fat dairy products, and lean protein. Do not eat a lot of foods that are high in solid fats, added sugars, or sodium. Maintain a healthy weight Body mass index (BMI) is used to identify weight problems. It estimates body fat based on height and weight. Your health care provider can help determine your BMI and help you achieve or maintain a healthy weight. Get regular exercise Get regular exercise. This is one of the most important things you can do for your health. Most adults should: Exercise for at least 150 minutes each week. The exercise should increase your heart rate and make you sweat (moderate-intensity exercise). Do strengthening exercises at least twice a week. This is in addition to the moderate-intensity exercise. Spend less time sitting. Even light physical activity can be beneficial. Watch cholesterol and blood lipids Have your blood tested for lipids and cholesterol at 57 years of age, then have this test every 5 years. Have your cholesterol levels checked more often if: Your lipid or cholesterol levels are high. You are older than 57 years of age. You are at high risk for heart disease. What should I know about cancer screening? Depending on your health history and family history, you may need to have cancer screening at various ages. This may include screening for: Breast cancer. Cervical cancer. Colorectal cancer. Skin cancer. Lung cancer. What should I know about heart disease, diabetes, and high blood  pressure? Blood pressure and heart disease High blood pressure causes heart disease and increases the risk of stroke. This is more likely to develop in people who have high blood pressure readings or are overweight. Have your blood pressure checked: Every 3-5 years if you are 26-104 years of age. Every year if you are 29 years old or older. Diabetes Have regular diabetes screenings. This checks your fasting blood sugar level. Have the screening done: Once every three years after age 74 if you are at a normal weight and have a low risk for diabetes. More often and at a younger age if you are overweight or have a high risk for diabetes. What should I know about preventing infection? Hepatitis B If you have a higher risk for hepatitis B, you should be screened for this virus. Talk with your health care provider to find out if you are at risk for hepatitis B infection. Hepatitis C Testing is recommended for: Everyone born from [birth year] through 1964. Anyone with known risk factors for hepatitis C. Sexually transmitted infections (STIs) Get screened for STIs, including gonorrhea and chlamydia, if: You are sexually active and are younger than 57 years of age. You are older than 57 years of age and your health care provider tells you that you are at risk for this type of infection. Your sexual activity has changed since you were last screened, and you are at increased risk for chlamydia or gonorrhea. Ask your health care provider if you are at risk. Ask your health care provider about whether you are at high risk for HIV. Your health care provider may recommend a prescription  medicine to help prevent HIV infection. If you choose to take medicine to prevent HIV, you should first get tested for HIV. You should then be tested every 3 months for as long as you are taking the medicine. Pregnancy If you are about to stop having your period (premenopausal) and you may become pregnant, seek counseling before you  get pregnant. Take 400 to 800 micrograms (mcg) of folic acid every day if you become pregnant. Ask for birth control (contraception) if you want to prevent pregnancy. Osteoporosis and menopause Osteoporosis is a disease in which the bones lose minerals and strength with aging. This can result in bone fractures. If you are 88 years old or older, or if you are at risk for osteoporosis and fractures, ask your health care provider if you should: Be screened for bone loss. Take a calcium or vitamin D  supplement to lower your risk of fractures. Be given hormone replacement therapy (HRT) to treat symptoms of menopause. Follow these instructions at home: Alcohol use Do not drink alcohol if: Your health care provider tells you not to drink. You are pregnant, may be pregnant, or are planning to become pregnant. If you drink alcohol: Limit how much you have to: 0-1 drink a day. Know how much alcohol is in your drink. In the [place] one drink equals one 12 oz bottle of beer (355 mL), one 5 oz glass of wine (148 mL), or one 1 oz glass of hard liquor (44 mL). Lifestyle Do not use any products that contain nicotine or tobacco. These products include cigarettes, chewing tobacco, and vaping devices, such as e-cigarettes. If you need help quitting, ask your health care provider. Do not use street drugs. Do not share needles. Ask your health care provider for help if you need support or information about quitting drugs. General instructions Schedule regular health, dental, and eye exams. Stay current with your vaccines. Tell your health care provider if: You often feel depressed. You have ever been abused or do not feel safe at home. Summary Adopting a healthy lifestyle and getting preventive care are important in promoting health and wellness. Follow your health care provider's instructions about healthy diet, exercising, and getting tested or screened for diseases. Follow your health care provider's  instructions on monitoring your cholesterol and blood pressure. This information is not intended to replace advice given to you by your health care provider. Make sure you discuss any questions you have with your health care provider. Document Revised: 05/17/2020 Document Reviewed: 05/17/2020 Elsevier Patient Education  2024 [employer].

## 2023-06-25 NOTE — Progress Notes (Addendum)
 I,Victoria T Basil Lim, CMA,acting as a Neurosurgeon for Smiley Dung, MD.,have documented all relevant documentation on the behalf of Smiley Dung, MD,as directed by  Smiley Dung, MD while in the presence of Smiley Dung, MD.  Subjective:    Patient ID: Erin Barrett , female    DOB: October 05, 1965 , 58 y.o.   MRN: [id number]  Chief Complaint  Patient presents with  . Annual Exam    Patient presents today for annual exam. She reports compliance with medications. Denies headache, chest pain & sob.  GYN: Estanislado Height.  She complains of having trouble with circulation in both legs. Primarily in left leg. She experiences numbness & tingling from her hips down to her feet. This has been an ongoing issue for a year.    . Hypertension    HPI  Hypertension This is a chronic problem. The current episode started more than 1 year ago. The problem has been gradually improving since onset. The problem is controlled. Pertinent negatives include no blurred vision. Risk factors for coronary artery disease include obesity and post-menopausal state. The current treatment provides moderate improvement.     Past Medical History:  Diagnosis Date  . Anxiety   . Arthritis   . Bilateral carpal tunnel syndrome 10/21/2019  . Bilateral swelling of feet and ankles   . Bursitis    left hip  . Bursitis of hip    Left  . CHF (congestive heart failure) (HCC)   . Constipation   . Depression   . Drug use   . Heartburn   . Hypertension   . Joint pain   . Lactose intolerance   . Obesity   . Osteoarthritis of both knees   . Shortness of breath   . Sleep apnea   . Sleep apnea   . Ulnar neuropathy at elbow, left 10/21/2019  . Vitamin D  deficiency      Family History  Problem Relation Age of Onset  . CVA Mother   . Hypertension Mother   . Heart disease Mother   . Stroke Mother   . Anxiety disorder Mother   . Obesity Mother   . Heart disease Father   . Hypertension Father   . Sleep apnea  Father   . Obesity Father      Current Outpatient Medications:  .  amLODipine  (NORVASC ) 10 MG tablet, TAKE 1 TABLET BY MOUTH EVERY DAY, Disp: 90 tablet, Rfl: 1 .  enalapril  (VASOTEC ) 20 MG tablet, TAKE 1 TABLET BY MOUTH EVERY DAY, Disp: 90 tablet, Rfl: 1 .  labetalol  (NORMODYNE ) 200 MG tablet, TAKE 2 TABLETS BY MOUTH TWICE A DAY, Disp: 360 tablet, Rfl: 0 .  Magnesium  500 MG CAPS, Take 1 capsule (500 mg total) by mouth daily., Disp: 90 capsule, Rfl: 1 .  Multiple Vitamins-Minerals (CENTRUM SILVER PO), Take by mouth., Disp: , Rfl:  .  VITAMIN D  PO, Take by mouth., Disp: , Rfl:  .  buPROPion  (WELLBUTRIN  XL) 300 MG 24 hr tablet, Take 1 tablet by mouth daily. (Patient not taking: Reported on 06/25/2023), Disp: , Rfl:  .  furosemide  (LASIX ) 40 MG tablet, TAKE 1 TABLET BY MOUTH EVERY DAY (Patient not taking: Reported on 06/25/2023), Disp: 90 tablet, Rfl: 1 .  hydrALAZINE  (APRESOLINE ) 50 MG tablet, TAKE 1 TABLET BY MOUTH THREE TIMES A DAY WITH FOOD (Patient not taking: Reported on 06/25/2023), Disp: 270 tablet, Rfl: 2 .  potassium chloride  SA (KLOR-CON  M20) 20 MEQ tablet, TAKE 1 TABLET BY MOUTH  EVERY DAY (Patient not taking: Reported on 06/25/2023), Disp: 90 tablet, Rfl: 1 .  WEGOVY  2.4 MG/0.75ML SOAJ, INJECT 2.4MG  INTO THE SKIN EVERY 7 DAYS, Disp: , Rfl:    No Known Allergies    The patient states she uses post menopausal status for birth control. Patient's last menstrual period was 04/29/2011.. Negative for Dysmenorrhea. Negative for: breast discharge, breast lump(s), breast pain and breast self exam. Associated symptoms include abnormal vaginal bleeding. Pertinent negatives include abnormal bleeding (hematology), anxiety, decreased libido, depression, difficulty falling sleep, dyspareunia, history of infertility, nocturia, sexual dysfunction, sleep disturbances, urinary incontinence, urinary urgency, vaginal discharge and vaginal itching. Diet regular.The patient states her exercise level is  moderate.   . The patient's tobacco use is:  Social History   Tobacco Use  Smoking Status Former  . Current packs/day: 0.00  . Average packs/day: 0.3 packs/day for 34.0 years (11.2 ttl pk-yrs)  . Types: Cigarettes  . Start date: 8  . Quit date: 01/1998  . Years since quitting: 25.4  Smokeless Tobacco Never  . She has been exposed to passive smoke. The patient's alcohol use is:  Social History   Substance and Sexual Activity  Alcohol Use Yes   Comment: OCCASIONALLY    Review of Systems  Constitutional: Negative.   HENT: Negative.    Eyes: Negative.  Negative for blurred vision.  Respiratory: Negative.    Cardiovascular: Negative.   Gastrointestinal: Negative.   Endocrine: Negative.   Genitourinary: Negative.   Musculoskeletal: Negative.   Skin: Negative.   Allergic/Immunologic: Negative.   Neurological: Negative.   Hematological: Negative.   Psychiatric/Behavioral: Negative.       Today's Vitals   06/25/23 0855  BP: 110/80  Pulse: 82  Temp: 98.4 F (36.9 C)  SpO2: 98%  Weight: 242 lb 12.8 oz (110.1 kg)  Height: 5' 6 (1.676 m)   Body mass index is 39.19 kg/m.  Wt Readings from Last 3 Encounters:  06/25/23 242 lb 12.8 oz (110.1 kg)  06/12/22 243 lb (110.2 kg)  04/05/22 255 lb (115.7 kg)     Objective:  Physical Exam      Assessment And Plan:     Encounter for general adult medical examination w/o abnormal findings  Essential hypertension, benign -     POCT URINALYSIS DIP (CLINITEK) -     Microalbumin / creatinine urine ratio -     EKG 12-Lead  Paresthesia of lower extremity  Vitamin D  deficiency disease  Class 2 severe obesity due to excess calories with serious comorbidity and body mass index (BMI) of 39.0 to 39.9 in adult [facility])   Return for 1 YEAR HM, 4 MONTH BPC. Patient was given opportunity to ask questions. Patient verbalized understanding of the plan and was able to repeat key elements of the plan. All questions were answered to their  satisfaction.   I, Smiley Dung, MD, have reviewed all documentation for this visit. The documentation on 06/25/23 for the exam, diagnosis, procedures, and orders are all accurate and complete.

## 2023-06-26 LAB — CBC
Hematocrit: 39.1 % (ref 34.0–46.6)
Hemoglobin: 12.7 g/dL (ref 11.1–15.9)
MCH: 31 pg (ref 26.6–33.0)
MCHC: 32.5 g/dL (ref 31.5–35.7)
MCV: 95 fL (ref 79–97)
Platelets: 247 10*3/uL (ref 150–450)
RBC: 4.1 x10E6/uL (ref 3.77–5.28)
RDW: 12.6 % (ref 11.7–15.4)
WBC: 4.7 10*3/uL (ref 3.4–10.8)

## 2023-06-26 LAB — CMP14+EGFR
ALT: 15 IU/L (ref 0–32)
AST: 23 IU/L (ref 0–40)
Albumin: 4.4 g/dL (ref 3.8–4.9)
Alkaline Phosphatase: 60 IU/L (ref 44–121)
BUN/Creatinine Ratio: 16 (ref 9–23)
BUN: 14 mg/dL (ref 6–24)
Bilirubin Total: 0.6 mg/dL (ref 0.0–1.2)
CO2: 21 mmol/L (ref 20–29)
Calcium: 9.5 mg/dL (ref 8.7–10.2)
Chloride: 103 mmol/L (ref 96–106)
Creatinine, Ser: 0.85 mg/dL (ref 0.57–1.00)
Globulin, Total: 2.4 g/dL (ref 1.5–4.5)
Glucose: 64 mg/dL — ABNORMAL LOW (ref 70–99)
Potassium: 4 mmol/L (ref 3.5–5.2)
Sodium: 141 mmol/L (ref 134–144)
Total Protein: 6.8 g/dL (ref 6.0–8.5)
eGFR: 80 mL/min/{1.73_m2} (ref >59)

## 2023-06-26 LAB — LIPID PANEL
Chol/HDL Ratio: 2.6 ratio (ref 0.0–4.4)
Cholesterol, Total: 149 mg/dL (ref 100–199)
HDL: 58 mg/dL (ref >39)
LDL Chol Calc (NIH): 81 mg/dL (ref 0–99)
Triglycerides: 43 mg/dL (ref 0–149)
VLDL Cholesterol Cal: 10 mg/dL (ref 5–40)

## 2023-06-26 LAB — MICROALBUMIN / CREATININE URINE RATIO
Creatinine, Urine: 42.7 mg/dL
Microalb/Creat Ratio: <7 mg/g{creat} (ref 0–29)
Microalbumin, Urine: <3 ug/mL

## 2023-06-26 LAB — HEMOGLOBIN A1C
Est. average glucose Bld gHb Est-mCnc: 97 mg/dL
Hgb A1c MFr Bld: 5 % (ref 4.8–5.6)

## 2023-06-26 LAB — VITAMIN B12: Vitamin B-12: 670 pg/mL (ref 232–1245)

## 2023-06-26 LAB — TSH: TSH: 0.86 u[IU]/mL (ref 0.450–4.500)

## 2023-06-26 LAB — VITAMIN D 25 HYDROXY (VIT D DEFICIENCY, FRACTURES): Vit D, 25-Hydroxy: 49.9 ng/mL (ref 30.0–100.0)

## 2023-06-28 ENCOUNTER — Telehealth: Payer: Self-pay

## 2023-06-28 NOTE — Assessment & Plan Note (Signed)
 Chronic, controlled. EKG performed, NSR w/ nonspecific T abnormality. Previously poorly controlled, now well-managed with labetalol , amlodipine , and enalapril . Adjusting labetalol  for consistent control. - Continue enalapril  and amlodipine  at current dosages. - Adjust labetalol  to 1 tablet twice a day, morning and evening. - Encourage home blood pressure monitoring.

## 2023-06-28 NOTE — Progress Notes (Signed)
 Complex Care Management Note  Care Guide Note 06/28/2023 Name: Erin Barrett MRN: [id number] DOB: 09-05-1965  Erin Barrett is a 58 y.o. year old female who sees Cleave Curling, MD for primary care. I reached out to Darlin Fehnel by phone today to offer complex care management services.  Erin Barrett was given information about Complex Care Management services today including:   The Complex Care Management services include support from the care team which includes your Nurse Care Manager, Clinical Social Worker, or Pharmacist.  The Complex Care Management team is here to help remove barriers to the health concerns and goals most important to you. Complex Care Management services are voluntary, and the patient may decline or stop services at any time by request to their care team member.   Complex Care Management Consent Status: Patient agreed to services and verbal consent obtained.   Follow up plan:  Telephone appointment with complex care management team member scheduled for:  07-17-23  Encounter Outcome:  Patient Scheduled   Erin Barrett [facility], [facility] Guide  Direct Dial: [phone]  Fax [phone]

## 2023-06-28 NOTE — Telephone Encounter (Signed)
 I decreased her meds. pls schedule her for [place] in four weeks. pls let her know, thanks    I called and left pt vm to call the office [facility]

## 2023-06-28 NOTE — Progress Notes (Signed)
 Complex Care Management Note Care Guide Note  06/28/2023 Name: Erin Barrett MRN: [id number] DOB: Nov 09, 1965   Complex Care Management Outreach Attempts: An unsuccessful telephone outreach was attempted today to offer the patient information about available complex care management services.  Follow Up Plan:  Additional outreach attempts will be made to offer the patient complex care management information and services.   Encounter Outcome:  No Answer  Creola Doheny [facility], [facility] Guide  Direct Dial: [phone]  Fax [phone]

## 2023-06-28 NOTE — Assessment & Plan Note (Signed)
 Chronic, reports compliance with CPAP therapy.  - Reminded to wear CPAP at least four hours per night - Diastolic heart failure resolved. Echocardiogram normal, ejection fraction 55-60%. Improvement due to weight loss, exercise, and blood pressure control. - Schedule follow-up echocardiogram next year.

## 2023-06-28 NOTE — Assessment & Plan Note (Signed)
 She was congratulated on her maintaining her weight loss. Input from [facility] is appreciated.  - Reminded to optimize her protein intake and incorporate strength training while on GLP-1 agonist therapy.

## 2023-06-28 NOTE — Assessment & Plan Note (Signed)
 Reports tingling in feet and leg tightness. Differential includes neuropathy or pinched nerve. Nerve conduction study considered. Blood work to assess thyroid, B12, A1c. - Order blood work including thyroid function, B12, and A1c. - Consider nerve conduction study. - Refer to Dr. Lydia Sams for further evaluation if needed.

## 2023-06-28 NOTE — Assessment & Plan Note (Signed)

## 2023-07-12 ENCOUNTER — Telehealth: Admitting: Licensed Clinical Social Worker

## 2023-07-17 ENCOUNTER — Telehealth: Payer: Self-pay | Admitting: Licensed Clinical Social Worker

## 2023-07-23 ENCOUNTER — Telehealth: Payer: Self-pay

## 2023-07-23 NOTE — Progress Notes (Signed)
 Complex Care Management Note  Care Guide Note 07/23/2023 Name: Erin Barrett MRN: [id number] DOB: 1965-09-28  Erin Barrett is a 58 y.o. year old female who sees Jarold Medici, MD for primary care. I reached out to Aayliah Goldsborough by phone today to offer complex care management services.  [name] was given information about Complex Care Management services today including:   The Complex Care Management services include support from the care team which includes your Nurse Care Manager, Clinical Social Worker, or Pharmacist.  The Complex Care Management team is here to help remove barriers to the health concerns and goals most important to you. Complex Care Management services are voluntary, and the patient may decline or stop services at any time by request to their care team member.   Complex Care Management Consent Status: Patient did not agree to participate in complex care management services at this time.  Follow up plan:  Unsuccessful telephone outreach attempt made. A HIPAA compliant phone message was left for the patient providing contact information and requesting a return call.  Encounter Outcome:  Patient Refused  Leotis Rase [facility], [facility] Guide  Direct Dial: [phone]  Fax [phone]

## 2023-07-30 NOTE — Progress Notes (Incomplete)
 58 y.o. [id number] female here for annual exam. Legally Separated.  Patient's last menstrual period was 04/29/2011.    She reports ***.  Urine sample provided: ***  Abnormal bleeding: *** Pelvic discharge or pain: *** Breast mass, nipple discharge or skin changes : ***  Sexually active: *** Birth control: None Last PAP:     Component Value Date/Time   DIAGPAP  03/29/2021 1021    - Negative for intraepithelial lesion or malignancy (NILM)   ADEQPAP  03/29/2021 1021    Satisfactory for evaluation; transformation zone component PRESENT.   Last mammogram: 04/19/22 Bi-Rads 1 Last colonoscopy: *** ***  Exercising: *** Smoker: ***  Flowsheet Row Office Visit from 06/25/2023 in [facility]  PHQ-2 Total Score 0    Flowsheet Row Office Visit from 06/25/2023 in [facility]  PHQ-9 Total Score 0     GYN HISTORY: ***  OB History  Gravida Para Term Preterm AB Living  2 2 2   2   SAB IAB Ectopic Multiple Live Births          # Outcome Date GA Lbr Len/2nd Weight Sex Type Anes PTL Lv  2 Term           1 Term            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   Vitamin D  deficiency    Past Surgical History:  Procedure Laterality Date   CARPAL TUNNEL RELEASE Bilateral 2021   CESAREAN SECTION     IR RADIOLOGIST EVAL & MGMT  01/10/2017   KNEE SURGERY Left 2012   Current Outpatient Medications on File Prior to Visit  Medication Sig Dispense Refill    amLODipine  (NORVASC ) 10 MG tablet TAKE 1 TABLET BY MOUTH EVERY DAY 90 tablet 1   enalapril  (VASOTEC ) 20 MG tablet TAKE 1 TABLET BY MOUTH EVERY DAY 90 tablet 1   furosemide  (LASIX ) 40 MG tablet Take 1 tablet (40 mg total) by mouth daily. prn 90 tablet 0   labetalol  (NORMODYNE ) 200 MG tablet TAKE 2 TABLETS BY MOUTH TWICE A DAY 360 tablet 0   Magnesium  500 MG CAPS Take 1 capsule (500 mg total) by mouth daily. 90 capsule 1   Multiple Vitamins-Minerals (CENTRUM SILVER PO) Take by mouth.     VITAMIN D  PO Take by mouth.     No current facility-administered medications on file prior to visit.   Social History   Socioeconomic History   Marital status: Legally Separated    Spouse name: Not on file   Number of children: Not on file   Years of education: Not on file   Highest education level: Not on file  Occupational History   Not on file  Tobacco Use   Smoking status: Former    Current packs/day:  0.00    Average packs/day: 0.3 packs/day for 34.0 years (11.2 ttl pk-yrs)    Types: Cigarettes    Start date: 30    Quit date: 01/1998    Years since quitting: 25.5   Smokeless tobacco: Never  Vaping Use   Vaping status: Never Used  Substance and Sexual Activity   Alcohol use: Yes    Comment: OCCASIONALLY   Drug use: Not Currently    Types: Marijuana    Comment: occ   Sexual activity: Not Currently    Partners: Male    Birth control/protection: Post-menopausal, Abstinence  Other Topics Concern   Not on file  Social History Narrative   Not on file   Social Drivers of Health   Financial Resource Strain: Not on file  Food Insecurity: Not on file  Transportation Needs: Not on file  Physical Activity: Not on file  Stress: Not on file  Social Connections: Not on file  Intimate Partner Violence: Not on file   Family History  Problem Relation Age of Onset   CVA Mother    Hypertension Mother    Heart disease Mother    Stroke Mother    Anxiety disorder Mother    Obesity Mother     Heart disease Father    Hypertension Father    Sleep apnea Father    Obesity Father    No Known Allergies   PE There were no vitals filed for this visit. There is no height or weight on file to calculate BMI.  Physical Exam    Assessment and Plan:        There are no diagnoses linked to this encounter. Clotilda FORBES Pa, CMA

## 2023-08-01 ENCOUNTER — Ambulatory Visit: Admitting: Obstetrics and Gynecology

## 2023-09-29 ENCOUNTER — Other Ambulatory Visit: Payer: Self-pay | Admitting: Internal Medicine

## 2023-11-07 ENCOUNTER — Other Ambulatory Visit: Payer: Self-pay | Admitting: Internal Medicine

## 2023-12-25 ENCOUNTER — Ambulatory Visit: Payer: Self-pay | Admitting: Internal Medicine

## 2023-12-25 ENCOUNTER — Ambulatory Visit: Payer: Self-pay

## 2023-12-25 NOTE — Progress Notes (Deleted)
 I,Erin Barrett, CMA,acting as a neurosurgeon for Erin LOISE Slocumb, MD.,have documented all relevant documentation on the behalf of Erin LOISE Slocumb, MD,as directed by  Erin LOISE Slocumb, MD while in the presence of Erin LOISE Slocumb, MD.  Subjective:  Patient ID: Erin Barrett , female    DOB: 04/21/1965 , 58 y.o.   MRN: [id number]  No chief complaint on file.   HPI  Pt presents today for bpc. She reports compliance with meds. Patient denies headache, dizziness, chest pain, SOB. Patient doesn't have any questions or concerns at this time.   Hypertension This is a chronic problem. The current episode started more than 1 year ago. The problem has been gradually improving since onset. The problem is controlled. Pertinent negatives include no blurred vision, chest pain, palpitations or shortness of breath. There are no associated agents to hypertension. Risk factors for coronary artery disease include obesity and sedentary lifestyle.     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   Vitamin D  deficiency      Family History  Problem Relation Age of Onset   CVA Mother    Hypertension Mother    Heart disease Mother    Stroke Mother    Anxiety disorder Mother    Obesity Mother    Heart disease Father    Hypertension Father    Sleep apnea Father    Obesity Father     Current Medications[1]   Allergies[2]   Review of Systems  Constitutional: Negative.   Eyes: Negative.  Negative for blurred vision.   Respiratory:  Negative for shortness of breath.   Cardiovascular:  Negative for chest pain and palpitations.  Musculoskeletal: Negative.   Skin: Negative.   Psychiatric/Behavioral: Negative.       There were no vitals filed for this visit. There is no height or weight on file to calculate BMI.  Wt Readings from Last 3 Encounters:  06/25/23 242 lb 12.8 oz (110.1 kg)  06/12/22 243 lb (110.2 kg)  04/05/22 255 lb (115.7 kg)    The 10-year ASCVD risk score (Arnett DK, et al., 2019) is: 2.5%   Values used to calculate the score:     Age: 8 years     Clinically relevant sex: Female     Is Non-Hispanic African American: Yes     Diabetic: No     Tobacco smoker: No     Systolic Blood Pressure: 110 mmHg     Is BP treated: Yes     HDL Cholesterol: 58 mg/dL     Total Cholesterol: 149 mg/dL  Objective:  Physical Exam      Assessment And Plan:   Assessment & Plan Essential hypertension, benign  Paresthesia of lower extremity  Chronic diastolic heart failure (HCC)   No orders of the defined types were placed in this encounter.  No follow-ups on file.  Patient was given opportunity to ask questions. Patient verbalized understanding of the plan and was able to repeat key elements of the plan. All questions were answered to their satisfaction.    I, Erin LOISE Slocumb, MD, have reviewed all documentation for this visit. The documentation on 12/25/2023 for the exam, diagnosis, procedures, and orders are all accurate and complete.   IF YOU HAVE BEEN REFERRED TO A SPECIALIST, IT MAY TAKE 1-2 WEEKS TO SCHEDULE/PROCESS THE REFERRAL. IF YOU HAVE NOT HEARD FROM US /SPECIALIST IN TWO WEEKS, PLEASE GIVE US  A CALL AT [phone] X [phone].      [1]  Current Outpatient Medications:    amLODipine  (NORVASC ) 10 MG tablet, TAKE 1 TABLET BY MOUTH EVERY DAY, Disp: 90 tablet, Rfl: 1   enalapril  (VASOTEC ) 20 MG tablet, TAKE 1 TABLET BY MOUTH EVERY DAY, Disp: 90 tablet, Rfl: 1   furosemide   (LASIX ) 40 MG tablet, Take 1 tablet (40 mg total) by mouth daily. prn, Disp: 90 tablet, Rfl: 0   labetalol  (NORMODYNE ) 200 MG tablet, TAKE 2 TABLETS BY MOUTH TWICE A DAY, Disp: 360 tablet, Rfl: 0   Magnesium  500 MG CAPS, Take 1 capsule (500 mg total) by mouth daily., Disp: 90 capsule, Rfl: 1   Multiple Vitamins-Minerals (CENTRUM SILVER PO), Take by mouth., Disp: , Rfl:    VITAMIN D  PO, Take by mouth., Disp: , Rfl:  [2] No Known Allergies

## 2023-12-25 NOTE — Telephone Encounter (Signed)
 FYI Only or Action Required?: FYI only for provider: appointment scheduled on 01/11/2023.  Patient was last seen in primary care on 06/25/2023 by Jarold Medici, MD.  Called Nurse Triage reporting Rectal Pain.  Symptoms began several months ago.  Interventions attempted: OTC medications: Advil.  Triage Disposition: See PCP When Office is Open (Within 3 Days)  Patient/caregiver understands and will follow disposition?: No (pt rescheduled to Jan per request as that is when her insurance starts)          Copied from CRM #[id number]. Topic: Clinical - Red Word Triage >> Dec 24, 2023 12:23 PM China J wrote: [facility] that prompted transfer to Nurse Triage: Patient needs to reschedule her appointment for tomorrow. She is having pain on both buttocks. This has been ongoing for 2-3 months. Sometimes pain causes legs to go numb. She is needing an appointment for March 3rd since she does not have insurance. >> Dec 24, 2023 12:42 PM China J wrote: Patient could not hold any longer. She will be looking for a call back and will be attempting to reach back out to us .  Reason for Disposition  [1] Home treatment > 3 days for rectal pain AND [2] not improved  Answer Assessment - Initial Assessment Questions Pt had called in yesterday to cancel her appointment this morning as her insurance does not start until Jan. This RN cancelled her appointment, but pt is concerned about being charged since she called in yesterday. This RN will send a message to clinic.   Pt scheduled for first available appointment in Jan. This RN explained to pt it would be recommended for pt to be seen sooner but this RN understands concern of no insurance currently.  Pt did not want to wait until 1/20 to see PCP and was fine with seeing a different provider for her buttocks pain. This RN educated pt on new-worsening symptoms and when to call back. Pt verbalized understanding and agrees to plan.   Buttocks pain 90% of the  day 3-4/10 pain level right now Ongoing 3-4 months Radiates down legs and can cause tingling when standing for a prolonged period of time  Cause: Pt states she has bursitis in L leg; pt states she has gained weight from not being able to workout  Protocols used: Rectal Symptoms-A-AH

## 2024-01-11 ENCOUNTER — Ambulatory Visit: Payer: Self-pay | Admitting: Family Medicine

## 2024-01-11 ENCOUNTER — Encounter: Payer: Self-pay | Admitting: Family Medicine

## 2024-01-11 VITALS — BP 122/80 | HR 78 | Temp 98.8°F | Ht 66.0 in | Wt 303.0 lb

## 2024-01-11 DIAGNOSIS — I5032 Chronic diastolic (congestive) heart failure: Secondary | ICD-10-CM | POA: Diagnosis not present

## 2024-01-11 DIAGNOSIS — G8929 Other chronic pain: Secondary | ICD-10-CM | POA: Diagnosis not present

## 2024-01-11 DIAGNOSIS — M25552 Pain in left hip: Secondary | ICD-10-CM | POA: Diagnosis not present

## 2024-01-11 DIAGNOSIS — E66813 Obesity, class 3: Secondary | ICD-10-CM | POA: Insufficient documentation

## 2024-01-11 DIAGNOSIS — M25561 Pain in right knee: Secondary | ICD-10-CM

## 2024-01-11 DIAGNOSIS — I110 Hypertensive heart disease with heart failure: Secondary | ICD-10-CM | POA: Diagnosis not present

## 2024-01-11 DIAGNOSIS — M25562 Pain in left knee: Secondary | ICD-10-CM

## 2024-01-11 DIAGNOSIS — Z23 Encounter for immunization: Secondary | ICD-10-CM

## 2024-01-11 LAB — BASIC METABOLIC PANEL WITH GFR
BUN/Creatinine Ratio: 17 (ref 9–23)
BUN: 17 mg/dL (ref 6–24)
CO2: 25 mmol/L (ref 20–29)
Calcium: 9.6 mg/dL (ref 8.7–10.2)
Chloride: 104 mmol/L (ref 96–106)
Creatinine, Ser: 1 mg/dL (ref 0.57–1.00)
Glucose: 93 mg/dL (ref 70–99)
Potassium: 4.5 mmol/L (ref 3.5–5.2)
Sodium: 143 mmol/L (ref 134–144)
eGFR: 66 mL/min/1.73

## 2024-01-11 MED ORDER — PREDNISONE 10 MG (21) PO TBPK: ORAL_TABLET | ORAL | 0 refills | Status: AC

## 2024-01-11 MED ORDER — FUROSEMIDE 40 MG PO TABS: 40.0000 mg | ORAL_TABLET | Freq: Every day | ORAL | 0 refills | Status: AC

## 2024-01-11 MED ORDER — MELOXICAM 7.5 MG PO TABS: 7.5000 mg | ORAL_TABLET | Freq: Every day | ORAL | 1 refills | Status: AC

## 2024-01-11 NOTE — Progress Notes (Signed)
 I,Erin Barrett, CMA,acting as a neurosurgeon for [employer], NP.,have documented all relevant documentation on the behalf of Erin Creighton, NP,as directed by  Erin Creighton, NP while in the presence of Erin Creighton, NP.  Subjective:  Patient ID: Erin Barrett , female    DOB: 1965-05-12 , 59 y.o.   MRN: [id number]  Chief Complaint  Patient presents with   Hip Pain    Patient presents today for a hip pain. She reports she has been drinking juices to try to help the pain but its not working.    leg numbness    Patient reports she has been having leg numbness and tightness in her hip.     HPI Discussed the use of AI scribe software for clinical note transcription with the patient, who gave verbal consent to proceed.  History of Present Illness Erin Barrett is a 59 year old female who presents with worsening hip pain and neuropathy.  She has been experiencing worsening pain in her left hip over the past few months, described as severe and accompanied by numbness that starts on the left side and extends down to her feet, occasionally affecting the right side. 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 The pain has intensified recently, prompting her visit.  She also has arthritis in both knees, with the left knee feeling tight and the right knee aching more. The pain in her knees and hip has significantly impacted her ability to engage in activities she enjoys, such as line dancing, which she had to stop three months ago due to her inability to stay on the floor for more than one dance.  She has a history of using Advil Duo for pain relief but tries to limit its use. She has also attempted home remedies, including juicing with beets, ginger, and lemons.  She mentions a past diagnosis of bursitis and arthritis, possibly documented by a previous doctor, and has seen an orthopedic doctor in the past for  bunions. She has not had insurance recently, which delayed her seeking medical care.  She has a history of congestive heart failure, which she believes has improved with weight loss. She has not experienced any recent exacerbations, such as swelling or shortness of breath, and attributes her improvement to maintaining her blood pressure and weight.  She has previously been on Wegovy , which helped control her hunger and sugar cravings, but had to stop due to insurance issues. She was also switched to Zepbound, which was not covered by her insurance. She has been trying to manage her weight through diet and exercise, although her pain has limited her ability to maintain her previous exercise regimen of walking 15,000 steps a day.   HPI   Past Medical History:  Diagnosis Date   Anxiety    Arthritis    Bilateral carpal tunnel syndrome 10/21/2019   Bilateral swelling of feet and ankles    Bursitis    left hip   Bursitis of hip    Left   CHF (congestive heart failure) (HCC)    Constipation    Depression    Drug use    Heartburn    Hypertension    Joint pain    Lactose intolerance    Obesity    Osteoarthritis of both knees    Shortness of breath    Sleep apnea    Sleep apnea    Ulnar neuropathy  at elbow, left 10/21/2019   Vitamin D  deficiency      Family History  Problem Relation Age of Onset   CVA Mother    Hypertension Mother    Heart disease Mother    Stroke Mother    Anxiety disorder Mother    Obesity Mother    Heart disease Father    Hypertension Father    Sleep apnea Father    Obesity Father     Current Medications[1]   Allergies[2]   Review of Systems  HENT: Negative.    Respiratory: Negative.    Cardiovascular: Negative.   Gastrointestinal: Negative.   Musculoskeletal:  Positive for arthralgias and gait problem.  Hematological: Negative.   Psychiatric/Behavioral: Negative.       Today's Vitals   01/11/24 0901  BP: 122/80  Pulse: 78  Temp: 98.8 F  (37.1 C)  TempSrc: Oral  Weight: (!) 303 lb (137.4 kg)  Height: 5' 6 (1.676 m)  PainSc: 6   PainLoc: Hip   Body mass index is 48.91 kg/m.  Wt Readings from Last 3 Encounters:  01/11/24 (!) 303 lb (137.4 kg)  06/25/23 242 lb 12.8 oz (110.1 kg)  06/12/22 243 lb (110.2 kg)    The 10-year ASCVD risk score (Arnett DK, et al., 2019) is: 3.5%   Values used to calculate the score:     Age: 54 years     Clinically relevant sex: Female     Is Non-Hispanic African American: Yes     Diabetic: No     Tobacco smoker: No     Systolic Blood Pressure: 122 mmHg     Is BP treated: Yes     HDL Cholesterol: 58 mg/dL     Total Cholesterol: 149 mg/dL  Objective:  Physical Exam Constitutional:      Appearance: Normal appearance.  HENT:     Head: Normocephalic.  Cardiovascular:     Rate and Rhythm: Normal rate and regular rhythm.     Pulses: Normal pulses.     Heart sounds: Normal heart sounds.  Pulmonary:     Effort: Pulmonary effort is normal.     Breath sounds: Normal breath sounds.  Abdominal:     General: Bowel sounds are normal.  Musculoskeletal:     Right lower leg: No edema.     Left lower leg: No edema.     Comments: Denies tenderness on palpation  Neurological:     Mental Status: She is alert and oriented to person, place, and time. Mental status is at baseline.         Assessment And Plan:   Assessment & Plan Chronic pain of both knees Chronic pain in left hip and knees, likely due to osteoarthritis and possible bursitis. Pain worsens with standing and walking. Differential includes arthritis and bursitis. - Prescribed tapered dose of prednisone. - Referred to orthopedic specialist for further evaluation and possible MRI. Chronic hip pain, left Chronic pain in left hip and knees, likely due to osteoarthritis and possible bursitis. Pain worsens with standing and walking. Differential includes arthritis and bursitis. - Prescribed tapered dose of prednisone. - Referred  to orthopedic specialist for further evaluation and possible MRI. Hypertensive heart disease with chronic diastolic congestive heart failure (HCC) Chronic diastolic congestive heart failure, well-managed with weight loss and blood pressure control. No recent exacerbations or symptoms. - Refilled furosemide  prescription. - Encouraged regular follow-up with cardiologist.  Morbid obesity due to excess calories (HCC) Severe obesity with comorbidities. Previous weight loss attempts unsuccessful  due to insurance issues. Current plan includes dietary modifications and increased physical activity. - Advised on dietary modifications, including reducing sugar intake and increasing protein consumption. - Encouraged regular physical activity, such as walking, to aid weight loss. Need for influenza vaccination Administer influenza vaccine in the clinic  Orders Placed This Encounter  Procedures   Flu vaccine trivalent PF, 6mos and older(Flulaval,Afluria,Fluarix,Fluzone)   Basic metabolic panel with GFR   AMB referral to orthopedics    Return if symptoms worsen or fail to improve, for she needs to reschedule her physical with DR JAROLD.  Patient was given opportunity to ask questions. Patient verbalized understanding of the plan and was able to repeat key elements of the plan. All questions were answered to their satisfaction.   I, Erin Creighton, NP, have reviewed all documentation for this visit. The documentation on 01/11/2024 for the exam, diagnosis, procedures, and orders are all accurate and complete.   IF YOU HAVE BEEN REFERRED TO A SPECIALIST, IT MAY TAKE 1-2 WEEKS TO SCHEDULE/PROCESS THE REFERRAL. IF YOU HAVE NOT HEARD FROM US /SPECIALIST IN TWO WEEKS, PLEASE GIVE US  A CALL AT [phone].      [1]  Current Outpatient Medications:    amLODipine  (NORVASC ) 10 MG tablet, TAKE 1 TABLET BY MOUTH EVERY DAY, Disp: 90 tablet, Rfl: 1   enalapril  (VASOTEC ) 20 MG tablet, TAKE 1 TABLET BY MOUTH EVERY  DAY, Disp: 90 tablet, Rfl: 1   labetalol  (NORMODYNE ) 200 MG tablet, TAKE 2 TABLETS BY MOUTH TWICE A DAY, Disp: 360 tablet, Rfl: 0   Magnesium  500 MG CAPS, Take 1 capsule (500 mg total) by mouth daily., Disp: 90 capsule, Rfl: 1   meloxicam (MOBIC) 7.5 MG tablet, Take 1 tablet (7.5 mg total) by mouth daily., Disp: 30 tablet, Rfl: 1   Multiple Vitamins-Minerals (CENTRUM SILVER PO), Take by mouth., Disp: , Rfl:    predniSONE (STERAPRED UNI-PAK 21 TAB) 10 MG (21) TBPK tablet, Please give instructions for DOSEPAK, Disp: 21 tablet, Rfl: 0   VITAMIN D  PO, Take by mouth., Disp: , Rfl:    furosemide  (LASIX ) 40 MG tablet, Take 1 tablet (40 mg total) by mouth daily. prn, Disp: 90 tablet, Rfl: 0 [2] No Known Allergies

## 2024-01-11 NOTE — Assessment & Plan Note (Signed)
 Chronic pain in left hip and knees, likely due to osteoarthritis and possible bursitis. Pain worsens with standing and walking. Differential includes arthritis and bursitis. - Prescribed tapered dose of prednisone. - Referred to orthopedic specialist for further evaluation and possible MRI.

## 2024-01-11 NOTE — Assessment & Plan Note (Signed)
 Administer influenza vaccine in the clinic

## 2024-01-11 NOTE — Assessment & Plan Note (Signed)
 Severe obesity with comorbidities. Previous weight loss attempts unsuccessful due to insurance issues. Current plan includes dietary modifications and increased physical activity. - Advised on dietary modifications, including reducing sugar intake and increasing protein consumption. - Encouraged regular physical activity, such as walking, to aid weight loss.

## 2024-01-11 NOTE — Assessment & Plan Note (Signed)
 Chronic diastolic congestive heart failure, well-managed with weight loss and blood pressure control. No recent exacerbations or symptoms. - Refilled furosemide  prescription. - Encouraged regular follow-up with cardiologist.

## 2024-01-30 ENCOUNTER — Ambulatory Visit (INDEPENDENT_AMBULATORY_CARE_PROVIDER_SITE_OTHER): Admitting: Student

## 2024-01-30 ENCOUNTER — Ambulatory Visit (HOSPITAL_BASED_OUTPATIENT_CLINIC_OR_DEPARTMENT_OTHER)

## 2024-01-30 DIAGNOSIS — M170 Bilateral primary osteoarthritis of knee: Secondary | ICD-10-CM

## 2024-01-30 DIAGNOSIS — M5441 Lumbago with sciatica, right side: Secondary | ICD-10-CM | POA: Diagnosis not present

## 2024-01-30 DIAGNOSIS — M25561 Pain in right knee: Secondary | ICD-10-CM

## 2024-01-30 DIAGNOSIS — M5442 Lumbago with sciatica, left side: Secondary | ICD-10-CM

## 2024-01-30 DIAGNOSIS — G8929 Other chronic pain: Secondary | ICD-10-CM | POA: Diagnosis not present

## 2024-01-30 DIAGNOSIS — M25562 Pain in left knee: Secondary | ICD-10-CM | POA: Diagnosis not present

## 2024-01-30 MED ORDER — BETAMETHASONE SOD PHOS & ACET 6 (3-3) MG/ML IJ SUSP
2.0000 mL | INTRAMUSCULAR | Status: AC | PRN
  Administered 2024-01-30: 2 mL via INTRA_ARTICULAR

## 2024-01-30 MED ORDER — LIDOCAINE HCL 1 % IJ SOLN
4.0000 mL | INTRAMUSCULAR | Status: AC | PRN
  Administered 2024-01-30: 4 mL

## 2024-01-30 NOTE — Progress Notes (Signed)
 "                                Chief Complaint: Bilateral knee pain    Discussed the use of AI scribe software for clinical note transcription with the patient, who gave verbal consent to proceed.  History of Present Illness Erin Barrett is a 59 year old female who presents for evaluation of worsening knee and hip symptoms.  She has had bilateral knee pain for many years with progressive worsening over the past year, left greater than right. Pain is intermittent but increasingly persistent with significant morning stiffness and a feeling of rigidity. She notes tightness and crepitus with movement without swelling. Pain limits line dancing, exercise, and walking. She had left knee arthroscopic debridement around 2012 and previous physical therapy, including water aerobics, which helped. She currently uses meloxicam , topical agents, heating pad, and daily Epsom salt/magnesium  baths with partial relief. A recent 1-week prednisone  course had no significant impact on her knees. She has not tried Voltaren gel.  She has chronic left lateral hip pain for several years, worsened by dance, exercise, and walking, and sometimes severe enough to disturb sleep when lying on the left side. She has not had targeted treatment for the hip. For over a year she has had numbness and neuropathic symptoms in both legs more often in the left leg. Symptoms worsen with prolonged standing or sitting. She has pain radiating from the left hip down the leg into the back with numbness, tingling, and back pain. Water aerobics previously improved symptoms but she is currently unable to participate due to schedule constraints.  Surgical History:   Left knee arthroscopy 2012  PMH/PSH/Family History/Social History/Meds/Allergies:    Past Medical History:  Diagnosis Date   Anxiety    Arthritis    Bilateral carpal tunnel syndrome 10/21/2019   Bilateral swelling of feet and ankles    Bursitis    left hip   Bursitis of hip     Left   CHF (congestive heart failure) (HCC)    Constipation    Depression    Drug use    Heartburn    Hypertension    Joint pain    Lactose intolerance    Obesity    Osteoarthritis of both knees    Shortness of breath    Sleep apnea    Sleep apnea    Ulnar neuropathy at elbow, left 10/21/2019   Vitamin D  deficiency    Past Surgical History:  Procedure Laterality Date   CARPAL TUNNEL RELEASE Bilateral 2021   CESAREAN SECTION     IR RADIOLOGIST EVAL & MGMT  01/10/2017   KNEE SURGERY Left 2012   Social History   Socioeconomic History   Marital status: Legally Separated    Spouse name: Not on file   Number of children: Not on file   Years of education: Not on file   Highest education level: Not on file  Occupational History   Not on file  Tobacco Use   Smoking status: Former    Current packs/day: 0.00    Average packs/day: 0.3 packs/day for 34.0 years (11.2 ttl pk-yrs)    Types: Cigarettes    Start date: 32    Quit date: 01/1998    Years since quitting: 26.0   Smokeless tobacco: Never  Vaping Use   Vaping status: Never Used  Substance and Sexual Activity   Alcohol use: Yes  Comment: OCCASIONALLY   Drug use: Not Currently    Types: Marijuana    Comment: occ   Sexual activity: Not Currently    Partners: Male    Birth control/protection: Post-menopausal, Abstinence  Other Topics Concern   Not on file  Social History Narrative   Not on file   Social Drivers of Health   Tobacco Use: Medium Risk (01/11/2024)   Patient History    Smoking Tobacco Use: Former    Smokeless Tobacco Use: Never    Passive Exposure: Not on Actuary Strain: Not on file  Food Insecurity: Not on file  Transportation Needs: Not on file  Physical Activity: Not on file  Stress: Not on file  Social Connections: Not on file  Depression (PHQ2-9): Low Risk (06/25/2023)   Depression (PHQ2-9)    PHQ-2 Score: 0  Alcohol Screen: Not on file  Housing: Not on file   Utilities: Not on file  Health Literacy: Not on file   Family History  Problem Relation Age of Onset   CVA Mother    Hypertension Mother    Heart disease Mother    Stroke Mother    Anxiety disorder Mother    Obesity Mother    Heart disease Father    Hypertension Father    Sleep apnea Father    Obesity Father    Allergies[1] Current Outpatient Medications  Medication Sig Dispense Refill   amLODipine  (NORVASC ) 10 MG tablet TAKE 1 TABLET BY MOUTH EVERY DAY 90 tablet 1   enalapril  (VASOTEC ) 20 MG tablet TAKE 1 TABLET BY MOUTH EVERY DAY 90 tablet 1   furosemide  (LASIX ) 40 MG tablet Take 1 tablet (40 mg total) by mouth daily. prn 90 tablet 0   labetalol  (NORMODYNE ) 200 MG tablet TAKE 2 TABLETS BY MOUTH TWICE A DAY 360 tablet 0   Magnesium  500 MG CAPS Take 1 capsule (500 mg total) by mouth daily. 90 capsule 1   meloxicam  (MOBIC ) 7.5 MG tablet Take 1 tablet (7.5 mg total) by mouth daily. 30 tablet 1   Multiple Vitamins-Minerals (CENTRUM SILVER PO) Take by mouth.     predniSONE  (STERAPRED UNI-PAK 21 TAB) 10 MG (21) TBPK tablet Please give instructions for DOSEPAK 21 tablet 0   VITAMIN D  PO Take by mouth.     No current facility-administered medications for this visit.   No results found.  Review of Systems:   A ROS was performed including pertinent positives and negatives as documented in the HPI.  Physical Exam :   Constitutional: NAD and appears stated age Neurological: Alert and oriented Psych: Appropriate affect and cooperative Last menstrual period 04/29/2011.   Comprehensive Musculoskeletal Exam:    Active range of motion of bilateral knees is from 0 to 120 degrees with palpable crepitus.  No laxity with varus or valgus stress.  Tenderness mainly in the left knee over the medial joint line.  Minimal to mild effusions without erythema or warmth.  Imaging:   Xray (right knee 4 views, left knee 4 views): Bilateral tricompartmental osteoarthritis with near complete loss of  joint space in the medial compartments.  Patellofemoral osteophytes more pronounced in the right knee.   I personally reviewed and interpreted the radiographs.      Assessment & Plan Bilateral primary osteoarthritis of knee She has longstanding bilateral knee osteoarthritis with moderate radiographic changes and significant functional limitation, more pronounced in the left knee. There is significant cartilage loss particularly in the medial compartments. Administered bilateral intra-articular corticosteroid injections.  Advised on expected injection benefits and that this should not be done more frequently than every 3 months. Recommended continued activity and exercise, including aquatic therapy, and a trial of topical Voltaren gel. Included knees in physical therapy referral for strength and mobility.  Chronic low back pain with bilateral sciatica She experiences chronic low back pain with bilateral radiating symptoms, more pronounced on the left, consistent with lumbar radiculopathy. Referred to physical therapy for targeted treatment of low back pain and sciatica, including hip and knee strengthening. Advised follow-up in 4-6 weeks to assess response to physical therapy. If no improvement after physical therapy, plan lumbar spine and hip radiographs and consider MRI.     Procedure Note  Patient: Erin Barrett             Date of Birth: 1965/05/14           MRN: [id number]             Visit Date: 01/30/2024  Procedures: Visit Diagnoses:  1. Bilateral primary osteoarthritis of knee   2. Chronic bilateral low back pain with bilateral sciatica     Large Joint Inj: bilateral knee on 01/30/2024 4:49 PM Indications: pain Details: 22 G 1.5 in needle, anterolateral approach Medications (Right): 4 mL lidocaine  1 %; 2 mL betamethasone  acetate-betamethasone  sodium phosphate 6 (3-3) MG/ML Medications (Left): 4 mL lidocaine  1 %; 2 mL betamethasone  acetate-betamethasone  sodium phosphate 6 (3-3)  MG/ML Outcome: tolerated well, no immediate complications Procedure, treatment alternatives, risks and benefits explained, specific risks discussed. Consent was given by the patient. Immediately prior to procedure a time out was called to verify the correct patient, procedure, equipment, support staff and site/side marked as required. Patient was prepped and draped in the usual sterile fashion.       I personally saw and evaluated the patient, and participated in the management and treatment plan.  Leonce Reveal, PA-C Orthopedics    [1] No Known Allergies  "

## 2024-03-05 ENCOUNTER — Ambulatory Visit: Payer: Self-pay | Admitting: Internal Medicine

## 2024-07-01 ENCOUNTER — Encounter: Payer: Self-pay | Admitting: Internal Medicine
